# Patient Record
Sex: Female | Born: 1935 | Race: White | Hispanic: No | Smoking: Current every day smoker
Health system: Southern US, Community
[De-identification: ages and names within clinical notes are randomized; demographics above are authoritative.]

## PROBLEM LIST (undated history)

## (undated) DIAGNOSIS — C50919 Malignant neoplasm of unspecified site of unspecified female breast: Secondary | ICD-10-CM

## (undated) HISTORY — PX: BREAST SURGERY: SHX581

## (undated) HISTORY — PX: ABDOMINAL HYSTERECTOMY: SHX81

---

## 2004-07-30 ENCOUNTER — Ambulatory Visit: Payer: Self-pay | Admitting: General Surgery

## 2004-08-18 ENCOUNTER — Other Ambulatory Visit: Payer: Self-pay

## 2004-08-18 ENCOUNTER — Emergency Department: Payer: Self-pay | Admitting: Emergency Medicine

## 2005-09-22 ENCOUNTER — Ambulatory Visit: Payer: Self-pay | Admitting: General Surgery

## 2005-11-12 ENCOUNTER — Ambulatory Visit: Payer: Self-pay | Admitting: General Surgery

## 2007-04-21 ENCOUNTER — Ambulatory Visit: Payer: Self-pay | Admitting: *Deleted

## 2009-02-26 ENCOUNTER — Ambulatory Visit: Payer: Self-pay | Admitting: Family Medicine

## 2009-07-16 ENCOUNTER — Ambulatory Visit: Payer: Self-pay | Admitting: General Surgery

## 2009-08-05 ENCOUNTER — Ambulatory Visit: Payer: Self-pay | Admitting: General Surgery

## 2009-08-15 ENCOUNTER — Ambulatory Visit: Payer: Self-pay | Admitting: General Surgery

## 2010-09-03 ENCOUNTER — Ambulatory Visit: Payer: Self-pay | Admitting: Family Medicine

## 2011-11-18 ENCOUNTER — Ambulatory Visit: Payer: Self-pay | Admitting: Unknown Physician Specialty

## 2012-10-06 ENCOUNTER — Emergency Department: Payer: Self-pay | Admitting: Emergency Medicine

## 2012-10-06 LAB — COMPREHENSIVE METABOLIC PANEL
BUN: 18 mg/dL (ref 7–18)
Bilirubin,Total: 0.4 mg/dL (ref 0.2–1.0)
Creatinine: 1.04 mg/dL (ref 0.60–1.30)
Osmolality: 281 (ref 275–301)
Potassium: 3.9 mmol/L (ref 3.5–5.1)
SGOT(AST): 19 U/L (ref 15–37)
Sodium: 140 mmol/L (ref 136–145)

## 2012-10-06 LAB — URINALYSIS, COMPLETE
Bacteria: NONE SEEN
Blood: NEGATIVE
Glucose,UR: NEGATIVE mg/dL (ref 0–75)
Leukocyte Esterase: NEGATIVE
Protein: NEGATIVE
RBC,UR: 1 /HPF (ref 0–5)
Specific Gravity: 1.008 (ref 1.003–1.030)
Squamous Epithelial: 2
WBC UR: NONE SEEN /HPF (ref 0–5)

## 2012-10-06 LAB — CBC
HGB: 13.5 g/dL (ref 12.0–16.0)
MCH: 30 pg (ref 26.0–34.0)
MCHC: 33.4 g/dL (ref 32.0–36.0)
Platelet: 227 10*3/uL (ref 150–440)
RBC: 4.51 10*6/uL (ref 3.80–5.20)
RDW: 13.8 % (ref 11.5–14.5)
WBC: 7.8 10*3/uL (ref 3.6–11.0)

## 2012-10-06 LAB — PRO B NATRIURETIC PEPTIDE: B-Type Natriuretic Peptide: 75 pg/mL (ref 0–450)

## 2013-11-14 ENCOUNTER — Inpatient Hospital Stay: Payer: Self-pay | Admitting: Specialist

## 2013-11-14 LAB — URINALYSIS, COMPLETE
BACTERIA: NONE SEEN
Bilirubin,UR: NEGATIVE
Blood: NEGATIVE
Glucose,UR: NEGATIVE mg/dL (ref 0–75)
Hyaline Cast: 7
Ketone: NEGATIVE
Leukocyte Esterase: NEGATIVE
Nitrite: NEGATIVE
PH: 5 (ref 4.5–8.0)
Protein: NEGATIVE
Specific Gravity: 1.015 (ref 1.003–1.030)
Squamous Epithelial: <1

## 2013-11-14 LAB — COMPREHENSIVE METABOLIC PANEL
ALBUMIN: 3.5 g/dL (ref 3.4–5.0)
Alkaline Phosphatase: 107 U/L
Anion Gap: 4 — ABNORMAL LOW (ref 7–16)
BILIRUBIN TOTAL: 0.3 mg/dL (ref 0.2–1.0)
BUN: 20 mg/dL — AB (ref 7–18)
CHLORIDE: 106 mmol/L (ref 98–107)
Calcium, Total: 9.4 mg/dL (ref 8.5–10.1)
Co2: 29 mmol/L (ref 21–32)
Creatinine: 1.38 mg/dL — ABNORMAL HIGH (ref 0.60–1.30)
EGFR (Non-African Amer.): 36 — ABNORMAL LOW
GFR CALC AF AMER: 41 — AB
GLUCOSE: 92 mg/dL (ref 65–99)
Osmolality: 280 (ref 275–301)
Potassium: 3.7 mmol/L (ref 3.5–5.1)
SGOT(AST): 12 U/L — ABNORMAL LOW (ref 15–37)
SGPT (ALT): 13 U/L (ref 12–78)
Sodium: 139 mmol/L (ref 136–145)
Total Protein: 7.4 g/dL (ref 6.4–8.2)

## 2013-11-14 LAB — TROPONIN I

## 2013-11-14 LAB — CBC
HCT: 44.5 % (ref 35.0–47.0)
HGB: 14.4 g/dL (ref 12.0–16.0)
MCH: 29.6 pg (ref 26.0–34.0)
MCHC: 32.3 g/dL (ref 32.0–36.0)
MCV: 92 fL (ref 80–100)
PLATELETS: 212 10*3/uL (ref 150–440)
RBC: 4.86 10*6/uL (ref 3.80–5.20)
RDW: 13.5 % (ref 11.5–14.5)
WBC: 8.3 10*3/uL (ref 3.6–11.0)

## 2013-11-14 LAB — CK TOTAL AND CKMB (NOT AT ARMC)
CK, Total: 31 U/L
CK-MB: <0.5 ng/mL — ABNORMAL LOW (ref 0.5–3.6)

## 2013-11-14 LAB — PRO B NATRIURETIC PEPTIDE: B-Type Natriuretic Peptide: 42 pg/mL (ref 0–450)

## 2013-11-15 LAB — CBC WITH DIFFERENTIAL/PLATELET
BASOS ABS: 0 10*3/uL (ref 0.0–0.1)
Basophil %: 0.2 %
Eosinophil #: 0 10*3/uL (ref 0.0–0.7)
Eosinophil %: 0 %
HCT: 43.1 % (ref 35.0–47.0)
HGB: 14.3 g/dL (ref 12.0–16.0)
LYMPHS ABS: 0.8 10*3/uL — AB (ref 1.0–3.6)
Lymphocyte %: 12.1 %
MCH: 30.4 pg (ref 26.0–34.0)
MCHC: 33.2 g/dL (ref 32.0–36.0)
MCV: 92 fL (ref 80–100)
Monocyte #: 0.1 x10 3/mm — ABNORMAL LOW (ref 0.2–0.9)
Monocyte %: 1.6 %
NEUTROS ABS: 5.9 10*3/uL (ref 1.4–6.5)
Neutrophil %: 86.1 %
PLATELETS: 204 10*3/uL (ref 150–440)
RBC: 4.71 10*6/uL (ref 3.80–5.20)
RDW: 13.5 % (ref 11.5–14.5)
WBC: 6.9 10*3/uL (ref 3.6–11.0)

## 2013-11-15 LAB — BASIC METABOLIC PANEL
ANION GAP: 8 (ref 7–16)
BUN: 28 mg/dL — AB (ref 7–18)
CHLORIDE: 106 mmol/L (ref 98–107)
CO2: 26 mmol/L (ref 21–32)
Calcium, Total: 9.4 mg/dL (ref 8.5–10.1)
Creatinine: 1.45 mg/dL — ABNORMAL HIGH (ref 0.60–1.30)
EGFR (African American): 39 — ABNORMAL LOW
EGFR (Non-African Amer.): 33 — ABNORMAL LOW
Glucose: 164 mg/dL — ABNORMAL HIGH (ref 65–99)
OSMOLALITY: 289 (ref 275–301)
Potassium: 4.5 mmol/L (ref 3.5–5.1)
SODIUM: 140 mmol/L (ref 136–145)

## 2013-11-16 LAB — BASIC METABOLIC PANEL
Anion Gap: 9 (ref 7–16)
BUN: 27 mg/dL — ABNORMAL HIGH (ref 7–18)
CHLORIDE: 109 mmol/L — AB (ref 98–107)
Calcium, Total: 9.2 mg/dL (ref 8.5–10.1)
Co2: 23 mmol/L (ref 21–32)
Creatinine: 1.12 mg/dL (ref 0.60–1.30)
EGFR (Non-African Amer.): 46 — ABNORMAL LOW
GFR CALC AF AMER: 53 — AB
GLUCOSE: 132 mg/dL — AB (ref 65–99)
Osmolality: 288 (ref 275–301)
Potassium: 4.7 mmol/L (ref 3.5–5.1)
SODIUM: 141 mmol/L (ref 136–145)

## 2013-11-18 LAB — BASIC METABOLIC PANEL
Anion Gap: 0 — ABNORMAL LOW (ref 7–16)
BUN: 26 mg/dL — ABNORMAL HIGH (ref 7–18)
CALCIUM: 8.7 mg/dL (ref 8.5–10.1)
CO2: 25 mmol/L (ref 21–32)
Chloride: 116 mmol/L — ABNORMAL HIGH (ref 98–107)
Creatinine: 1.2 mg/dL (ref 0.60–1.30)
EGFR (African American): 49 — ABNORMAL LOW
EGFR (Non-African Amer.): 42 — ABNORMAL LOW
Glucose: 65 mg/dL (ref 65–99)
Osmolality: 284 (ref 275–301)
Potassium: 3.9 mmol/L (ref 3.5–5.1)
SODIUM: 141 mmol/L (ref 136–145)

## 2013-11-19 LAB — PLATELET COUNT: PLATELETS: 190 10*3/uL (ref 150–440)

## 2013-12-03 ENCOUNTER — Ambulatory Visit: Payer: Self-pay | Admitting: Pain Medicine

## 2013-12-03 ENCOUNTER — Other Ambulatory Visit: Payer: Self-pay | Admitting: Pain Medicine

## 2013-12-03 LAB — BASIC METABOLIC PANEL
Anion Gap: 7 (ref 7–16)
BUN: 23 mg/dL — AB (ref 7–18)
Calcium, Total: 9.3 mg/dL (ref 8.5–10.1)
Chloride: 105 mmol/L (ref 98–107)
Co2: 28 mmol/L (ref 21–32)
Creatinine: 1.65 mg/dL — ABNORMAL HIGH (ref 0.60–1.30)
GFR CALC AF AMER: 33 — AB
GFR CALC NON AF AMER: 29 — AB
GLUCOSE: 80 mg/dL (ref 65–99)
Osmolality: 282 (ref 275–301)
Potassium: 4.1 mmol/L (ref 3.5–5.1)
Sodium: 140 mmol/L (ref 136–145)

## 2013-12-03 LAB — HEPATIC FUNCTION PANEL A (ARMC)
ALBUMIN: 3.6 g/dL (ref 3.4–5.0)
AST: 25 U/L (ref 15–37)
Alkaline Phosphatase: 115 U/L
Bilirubin, Direct: <0.1 mg/dL (ref 0.00–0.20)
Bilirubin,Total: 0.4 mg/dL (ref 0.2–1.0)
SGPT (ALT): 29 U/L (ref 12–78)
TOTAL PROTEIN: 7.1 g/dL (ref 6.4–8.2)

## 2013-12-03 LAB — SEDIMENTATION RATE: Erythrocyte Sed Rate: 22 mm/hr (ref 0–30)

## 2013-12-03 LAB — MAGNESIUM: MAGNESIUM: 2.4 mg/dL

## 2013-12-20 ENCOUNTER — Ambulatory Visit: Payer: Self-pay | Admitting: Pain Medicine

## 2014-01-08 ENCOUNTER — Emergency Department: Payer: Self-pay | Admitting: Emergency Medicine

## 2014-01-08 LAB — URINALYSIS, COMPLETE
BACTERIA: NONE SEEN
BILIRUBIN, UR: NEGATIVE
Blood: NEGATIVE
Glucose,UR: NEGATIVE mg/dL (ref 0–75)
KETONE: NEGATIVE
Leukocyte Esterase: NEGATIVE
NITRITE: NEGATIVE
PH: 6 (ref 4.5–8.0)
Protein: NEGATIVE
RBC,UR: NONE SEEN /HPF (ref 0–5)
Specific Gravity: 1.005 (ref 1.003–1.030)
Squamous Epithelial: 1
WBC UR: <1 /HPF (ref 0–5)

## 2014-01-08 LAB — COMPREHENSIVE METABOLIC PANEL
Albumin: 3.5 g/dL (ref 3.4–5.0)
Alkaline Phosphatase: 91 U/L
Anion Gap: 6 — ABNORMAL LOW (ref 7–16)
BUN: 17 mg/dL (ref 7–18)
Bilirubin,Total: 0.4 mg/dL (ref 0.2–1.0)
CALCIUM: 9.3 mg/dL (ref 8.5–10.1)
Chloride: 105 mmol/L (ref 98–107)
Co2: 29 mmol/L (ref 21–32)
Creatinine: 1.31 mg/dL — ABNORMAL HIGH (ref 0.60–1.30)
GFR CALC AF AMER: 44 — AB
GFR CALC NON AF AMER: 38 — AB
Glucose: 91 mg/dL (ref 65–99)
OSMOLALITY: 281 (ref 275–301)
Potassium: 3.8 mmol/L (ref 3.5–5.1)
SGOT(AST): 12 U/L — ABNORMAL LOW (ref 15–37)
SGPT (ALT): 19 U/L (ref 12–78)
Sodium: 140 mmol/L (ref 136–145)
Total Protein: 7 g/dL (ref 6.4–8.2)

## 2014-01-08 LAB — CBC
HCT: 39.1 % (ref 35.0–47.0)
HGB: 13.9 g/dL (ref 12.0–16.0)
MCH: 32.9 pg (ref 26.0–34.0)
MCHC: 35.4 g/dL (ref 32.0–36.0)
MCV: 93 fL (ref 80–100)
Platelet: 187 10*3/uL (ref 150–440)
RBC: 4.22 10*6/uL (ref 3.80–5.20)
RDW: 14.4 % (ref 11.5–14.5)
WBC: 6.9 10*3/uL (ref 3.6–11.0)

## 2014-01-08 LAB — CK TOTAL AND CKMB (NOT AT ARMC)
CK, Total: 38 U/L
CK-MB: 0.7 ng/mL (ref 0.5–3.6)

## 2014-01-08 LAB — TROPONIN I: Troponin-I: <0.02 ng/mL

## 2014-02-11 LAB — LIPASE, BLOOD: LIPASE: 215 U/L (ref 73–393)

## 2014-02-11 LAB — CBC WITH DIFFERENTIAL/PLATELET
BASOS PCT: 1.1 %
Basophil #: 0.1 10*3/uL (ref 0.0–0.1)
EOS PCT: 2.7 %
Eosinophil #: 0.2 10*3/uL (ref 0.0–0.7)
HCT: 39.9 % (ref 35.0–47.0)
HGB: 12.7 g/dL (ref 12.0–16.0)
Lymphocyte #: 2.3 10*3/uL (ref 1.0–3.6)
Lymphocyte %: 33.1 %
MCH: 29.8 pg (ref 26.0–34.0)
MCHC: 31.8 g/dL — AB (ref 32.0–36.0)
MCV: 94 fL (ref 80–100)
Monocyte #: 0.6 x10 3/mm (ref 0.2–0.9)
Monocyte %: 8.4 %
NEUTROS PCT: 54.7 %
Neutrophil #: 3.8 10*3/uL (ref 1.4–6.5)
Platelet: 186 10*3/uL (ref 150–440)
RBC: 4.26 10*6/uL (ref 3.80–5.20)
RDW: 14.2 % (ref 11.5–14.5)
WBC: 7 10*3/uL (ref 3.6–11.0)

## 2014-02-11 LAB — BASIC METABOLIC PANEL
Anion Gap: 8 (ref 7–16)
BUN: 17 mg/dL (ref 7–18)
Calcium, Total: 8.9 mg/dL (ref 8.5–10.1)
Chloride: 107 mmol/L (ref 98–107)
Co2: 26 mmol/L (ref 21–32)
Creatinine: 1.27 mg/dL (ref 0.60–1.30)
GFR CALC AF AMER: 46 — AB
GFR CALC NON AF AMER: 39 — AB
Glucose: 82 mg/dL (ref 65–99)
OSMOLALITY: 282 (ref 275–301)
Potassium: 3.8 mmol/L (ref 3.5–5.1)
SODIUM: 141 mmol/L (ref 136–145)

## 2014-02-11 LAB — TROPONIN I

## 2014-02-12 ENCOUNTER — Observation Stay: Payer: Self-pay | Admitting: Internal Medicine

## 2014-02-12 LAB — URINALYSIS, COMPLETE
BILIRUBIN, UR: NEGATIVE
Bacteria: NONE SEEN
Blood: NEGATIVE
Glucose,UR: NEGATIVE mg/dL (ref 0–75)
Hyaline Cast: 3
KETONE: NEGATIVE
Leukocyte Esterase: NEGATIVE
Nitrite: NEGATIVE
PH: 6 (ref 4.5–8.0)
Protein: NEGATIVE
RBC,UR: <1 /HPF (ref 0–5)
SPECIFIC GRAVITY: 1.018 (ref 1.003–1.030)
WBC UR: 1 /HPF (ref 0–5)

## 2014-02-12 LAB — TSH: Thyroid Stimulating Horm: 1.12 u[IU]/mL

## 2014-02-13 LAB — BASIC METABOLIC PANEL
Anion Gap: 9 (ref 7–16)
BUN: 12 mg/dL (ref 7–18)
Calcium, Total: 8.3 mg/dL — ABNORMAL LOW (ref 8.5–10.1)
Chloride: 113 mmol/L — ABNORMAL HIGH (ref 98–107)
Co2: 22 mmol/L (ref 21–32)
Creatinine: 1.15 mg/dL (ref 0.60–1.30)
EGFR (African American): 51 — ABNORMAL LOW
EGFR (Non-African Amer.): 44 — ABNORMAL LOW
Glucose: 88 mg/dL (ref 65–99)
Osmolality: 286 (ref 275–301)
Potassium: 4.2 mmol/L (ref 3.5–5.1)
Sodium: 144 mmol/L (ref 136–145)

## 2014-02-13 LAB — CBC WITH DIFFERENTIAL/PLATELET
Basophil #: 0.1 10*3/uL (ref 0.0–0.1)
Basophil %: 1.2 %
Eosinophil #: 0.2 10*3/uL (ref 0.0–0.7)
Eosinophil %: 3.1 %
HCT: 36.1 % (ref 35.0–47.0)
HGB: 12.1 g/dL (ref 12.0–16.0)
Lymphocyte #: 2 10*3/uL (ref 1.0–3.6)
Lymphocyte %: 30.7 %
MCH: 31.2 pg (ref 26.0–34.0)
MCHC: 33.5 g/dL (ref 32.0–36.0)
MCV: 93 fL (ref 80–100)
Monocyte #: 0.5 x10 3/mm (ref 0.2–0.9)
Monocyte %: 8.3 %
Neutrophil #: 3.7 10*3/uL (ref 1.4–6.5)
Neutrophil %: 56.7 %
Platelet: 168 10*3/uL (ref 150–440)
RBC: 3.88 10*6/uL (ref 3.80–5.20)
RDW: 14 % (ref 11.5–14.5)
WBC: 6.5 10*3/uL (ref 3.6–11.0)

## 2014-02-13 LAB — TSH: Thyroid Stimulating Horm: 2.24 u[IU]/mL

## 2014-03-06 ENCOUNTER — Ambulatory Visit: Payer: Self-pay | Admitting: Pain Medicine

## 2014-03-21 ENCOUNTER — Ambulatory Visit: Payer: Self-pay | Admitting: Pain Medicine

## 2014-04-22 ENCOUNTER — Ambulatory Visit: Payer: Self-pay | Admitting: Pain Medicine

## 2014-05-09 ENCOUNTER — Ambulatory Visit: Payer: Self-pay | Admitting: Pain Medicine

## 2014-10-19 NOTE — H&P (Signed)
PATIENT NAME:  Jasmine Hays, Jasmine Hays MR#:  [id number] DATE OF BIRTH:  1930/05/05  DATE OF ADMISSION:  11/14/2013  ADDENDUM  ASSESSMENT AND PLAN: 7. Tobacco abuse. Smoking cessation counseling done three minutes by me. Nicotine patch ordered.   TIME SPENT ON ADMISSION: Total time 55 minutes.    ____________________________ Tana Conch. Leslye Peer, MD rjw:sg D: 11/14/2013 11:53:03 ET T: 11/14/2013 12:03:24 ET JOB#: [id number]  cc: Tana Conch. Leslye Peer, MD, <Dictator> Marisue Brooklyn MD ELECTRONICALLY SIGNED 11/15/2013 19:36
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
## 2014-10-19 NOTE — Discharge Summary (Signed)
PATIENT NAME:  Jasmine Hays, Jasmine Hays MR#:  [id number] DATE OF BIRTH:  [birth year]  DATE OF ADMISSION:  02/12/2014 DATE OF DISCHARGE:  02/13/2014   ADMITTING DIAGNOSES: 1.  Lower extremity weakness, acute on chronic.  2.  Constipation.  3.  Chronic obstructive pulmonary disease without exacerbation.  4.  Tobacco abuse.  5.  Hyperlipidemia.  6.  Depression.   CONSULTATIONS: None.   PROCEDURES: None.   BRIEF HISTORY OF PRESENT ILLNESS: This very pleasant 81 year old female presents to the Emergency Department due to inability to stand due to lower extremity weakness. The patient was recently discharged from the hospital for the same complaints and had been working with physical therapy until today. Today, the physical therapist determined that the patient had become acutely weaker. She denies any pain, but admits to having weakness in the lower extremities. She also admits to having decreased appetite and decreased intake of fluid.   HOSPITAL COURSE BY PROBLEM: 1.  Lower extremity weakness, acute on chronic: The patient has been working with physical therapy for this prior to admission. The patient has spinal stenosis and now also has constipation, which may be complicating the stenosis and lower extremity weakness. Physical therapy evaluation recommends SNF placement. However, the patient and family have declined. She will restart home health physical therapy. She may need a home health aide as well, as the patient feels her daughter is not able to perform all tests involved in her care, specifically bathing.  2.  Constipation: Treated with MiraLax twice a day as well as a soapsuds enema today to facilitate bowel movement. This is a chronic condition, but should be followed up on by her primary care physician as constipation can complicate her stenosis and lower extremity weakness.  3.  COPD without exacerbation: No changes were made to her home regimen during or after hospitalization.  4.  Ongoing  tobacco abuse: She was treated with Nicoderm patches during hospitalization, so smoking cessation counseling was provided.  5.  Hyperlipidemia: Continue statin.  6.  Depression: The patient denied any active signs of depression. She will continue on Wellbutrin.   DISCHARGE MEDICATIONS:  1.  Proventil HFA 90 mcg/inhalation, 2 puffs every 4 hours as needed for shortness of breath.  2.  Spiriva 18 mcg, 1 capsule inhaled daily.  3.  Lasix 20 mg, 1 tablet once a day.  4.  Lisinopril 20 mg, 1 tablet daily.  5.  Bupropion 150 mcg, 1 tablet 2 times a day.  6.  Lidocaine 5% topical film, apply to affected area once a day.  7.  Colace 100 mg, 1 capsule twice a day.  8.  Clonazepam 1 mg, 1 tablet once a day at bedtime.  9.  Tramadol 50 mg, 1 tablet every 6 hours as needed for pain.  10.  Vitamin D3 at 2000 international units, 1 tablet once a day x 30 days.  11.  Trazodone 100 mg, 1 tablet once a day at bedtime.  12.  Pravastatin 20 mg, 1 tablet once a day at bedtime.  13.  Polyethylene glycol 3350 oral power, 17 grams orally twice a day for bowel movements.  DISCHARGE EXAMINATION: VITAL SIGNS: Day of discharge, temperature 98.2, pulse 64, respirations 18, blood pressure 134/79, pulse oximetry 94% on room air.  GENERAL: No acute distress.  RESPIRATORY: Lungs are clear to auscultation bilaterally with good air movement.  CARDIOVASCULAR: Regular rate and rhythm. No murmurs, rubs or gallops. No lower extremity edema, no JVD.  ABDOMEN: Denies tenderness. Abdomen is  soft, slightly distended, normal bowel sounds.  EXTREMITIES: No edema, no cyanosis or clubbing.  SKIN: Normal to palpation. No rash.  NEUROLOGIC: Cranial nerves are intact, sensation is intact, bilateral lower extremities are weak at 4/5.  PSYCHIATRIC: The patient is alert and oriented to time, place, person and has good insight.   LABORATORY DATA: Sodium 144, potassium 4.2, chloride 113, bicarbonate 23, BUN 12, creatinine 1.15. TSH is  2.24. [place]ak blood cell count 6.5, hemoglobin 12.1, platelets 164,000, MCV is 93. Urine is negative for signs of infection.   DISPOSITION: The patient is stable for discharge to home, she has been advised to go to a skilled nursing facility for continued intensive physical therapy but she has declined. She will have home health physical therapy.   DIET: Heart healthy diet.   ACTIVITY: As tolerated and per physical therapy recommendations.   FOLLOWUP: The patient is advised to follow up with her primary care physician within 1-2 weeks of discharge.    ____________________________ Earleen Newport. Volanda Napoleon, MD cpw:TT D: 02/13/2014 16:56:38 ET T: 02/13/2014 17:31:54 ET JOB#: [id number]  cc: Barnetta Chapel P. Volanda Napoleon, MD, <Dictator> Aldean Jewett MD ELECTRONICALLY SIGNED 02/21/2014 13:15

## 2014-10-19 NOTE — Discharge Summary (Signed)
PATIENT NAME:  Jasmine Hays, Jasmine Hays MR#:  [id number] DATE OF BIRTH:  1930-05-07  DATE OF ADMISSION:  11/14/2013 DATE OF DISCHARGE:  11/21/2013   For a detailed note, please take a look at the history and physical done on admission by Dr. Loletha Grayer.   DIAGNOSES AT DISCHARGE: As follows:  1. Weakness secondary to spinal stenosis.  2. Chronic obstructive pulmonary disease exacerbation.  3. Hyperlipidemia.  4. Depression.  5. Tobacco abuse.  6. Constipation.   DIET: The patient is being discharged on a low-sodium, low-fat diet.   ACTIVITY: As tolerated.   FOLLOWUP: With Dr. Delight Stare in the next 1 to 2 weeks.   DISCHARGE MEDICATIONS: 1. Lisinopril 20 mg daily.  2. Albuterol inhaler 2 puffs every 4 hours as needed. 3. Spiriva 1 puff daily. 4. Pravachol 20 mg daily. 5. Lasix 20 mg daily. 6. Trazodone 50 mg at bedtime. 7. Wellbutrin 150 mg b.i.d. 8. Klonopin 1 mg t.i.d. 9. Lidocaine patch to be applied daily.  10. Prednisone taper starting at 50 mg down to 10 mg over the next 5 days.  11. Tramadol 50 mg q.6 hours as needed.  12. Colace 100 mg b.i.d. 13. MiraLax daily as needed. 14. Nicotine patch 14 mg transdermal daily.   PERTINENT STUDIES DONE DURING THE HOSPITAL COURSE: As follows:  A CT scan of the head done without contrast on May 20th showing no acute intracranial pathology.  A chest x-ray done on admission showing no acute infiltrate or pneumothorax.  X-ray of the left hip showing no acute left hip fracture.  An MRI of the lumbar spine done without contrast showing slight progression of the acquired spinal stenosis at L1-L2, L2-L3 and L3-L4. Acquired stenosis of the L4-L5 and L5-S1 is stable.   HOSPITAL COURSE: This is an 81 year old female with medical problems as mentioned above, who presented to the hospital on 11/14/2013 due to difficulty walking and generalized weakness.   1. Weakness and difficulty ambulating. The patient presented to the hospital with difficulty  getting out of bed and lower extremity weakness. She was thought to possibly have underlying CVA, and therefore had a CT head on admission, which was negative. She continued to have significant lower extremity weakness, and therefore an MRI of the lumbar spine was obtained, which showed spinal stenosis. This was likely the cause of the patient's progressive weakness. A physical therapy consult was obtained to evaluate the patient's mobility status, and they thought she would benefit from short-term rehab, which is where presently she is being discharged to.  2. Lower back pain with spinal stenosis. This was confirmed based on the MRI study, as mentioned above. The patient has been maintained on some Lidoderm patch and some tramadol, which she will continue for her back pain.  3. Chronic obstructive pulmonary disease exacerbation. This was secondary to ongoing tobacco abuse. There was no evidence of acute pneumonia. There was possible underlying bronchitis. The patient has received Zithromax for the acute bronchitis. Is currently being discharged on albuterol inhaler and Spiriva daily. Her acute exacerbation has now resolved.  4. Tobacco abuse. The patient was maintained on nicotine patch, and she is being discharged on that presently.  5. Hypertension. The patient has remained hemodynamically stable in the hospital on her antihypertensives. She will continue her lisinopril as stated.  6. Depression. The patient was maintained on her Wellbutrin and Klonopin, and she will also resume that upon discharge.  7. Hyperlipidemia. The patient was maintained on Pravachol, and she will resume that  upon discharge too.   CODE STATUS: The patient is a full code.   DISPOSITION: She is being discharged to a skilled nursing facility.   TIME SPENT: 40 minutes.   ____________________________ Belia Heman. Verdell Carmine, MD vjs:lb D: 11/21/2013 11:49:51 ET T: 11/21/2013 12:07:15 ET JOB#: [id number]  cc: Belia Heman. Verdell Carmine, MD,  <Dictator> Marguerita Merles, MD Henreitta Leber MD ELECTRONICALLY SIGNED 11/21/2013 15:09
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
## 2015-04-08 ENCOUNTER — Other Ambulatory Visit: Payer: Self-pay | Admitting: Internal Medicine

## 2015-11-16 ENCOUNTER — Emergency Department
Admission: EM | Admit: 2015-11-16 | Discharge: 2015-11-16 | Disposition: A | Discharge status: Home / Self Care | Payer: Medicare PPO | Attending: Emergency Medicine | Admitting: Emergency Medicine

## 2015-11-16 ENCOUNTER — Emergency Department: Payer: Medicare PPO

## 2015-11-16 DIAGNOSIS — M25551 Pain in right hip: Secondary | ICD-10-CM | POA: Diagnosis present

## 2015-11-16 DIAGNOSIS — G8929 Other chronic pain: Secondary | ICD-10-CM | POA: Diagnosis not present

## 2015-11-16 DIAGNOSIS — Z853 Personal history of malignant neoplasm of breast: Secondary | ICD-10-CM | POA: Insufficient documentation

## 2015-11-16 DIAGNOSIS — F17210 Nicotine dependence, cigarettes, uncomplicated: Secondary | ICD-10-CM | POA: Insufficient documentation

## 2015-11-16 HISTORY — DX: Malignant neoplasm of unspecified site of unspecified female breast: C50.919

## 2015-11-16 LAB — BASIC METABOLIC PANEL
ANION GAP: 4 — AB (ref 5–15)
BUN: 21 mg/dL — ABNORMAL HIGH (ref 6–20)
CALCIUM: 9.3 mg/dL (ref 8.9–10.3)
CO2: 28 mmol/L (ref 22–32)
CREATININE: 1.19 mg/dL — AB (ref 0.44–1.00)
Chloride: 107 mmol/L (ref 101–111)
GFR, EST AFRICAN AMERICAN: 47 mL/min — AB (ref >60)
GFR, EST NON AFRICAN AMERICAN: 41 mL/min — AB (ref >60)
Glucose, Bld: 96 mg/dL (ref 65–99)
Potassium: 4 mmol/L (ref 3.5–5.1)
SODIUM: 139 mmol/L (ref 135–145)

## 2015-11-16 LAB — CBC
HCT: 41 % (ref 35.0–47.0)
Hemoglobin: 13.8 g/dL (ref 12.0–16.0)
MCH: 29.6 pg (ref 26.0–34.0)
MCHC: 33.7 g/dL (ref 32.0–36.0)
MCV: 88 fL (ref 80.0–100.0)
PLATELETS: 202 10*3/uL (ref 150–440)
RBC: 4.66 MIL/uL (ref 3.80–5.20)
RDW: 13.9 % (ref 11.5–14.5)
WBC: 8.6 10*3/uL (ref 3.6–11.0)

## 2015-11-16 LAB — URINALYSIS COMPLETE WITH MICROSCOPIC (ARMC ONLY)
BACTERIA UA: NONE SEEN
BILIRUBIN URINE: NEGATIVE
GLUCOSE, UA: NEGATIVE mg/dL
HGB URINE DIPSTICK: NEGATIVE
Ketones, ur: NEGATIVE mg/dL
LEUKOCYTES UA: NEGATIVE
NITRITE: NEGATIVE
Protein, ur: NEGATIVE mg/dL
SPECIFIC GRAVITY, URINE: 1.016 (ref 1.005–1.030)
pH: 6 (ref 5.0–8.0)

## 2015-11-16 MED ORDER — HYDROCODONE-ACETAMINOPHEN 5-325 MG PO TABS: 0.5000 | ORAL_TABLET | Freq: Four times a day (QID) | ORAL | Status: DC | PRN

## 2015-11-16 MED ORDER — MORPHINE SULFATE (PF) 2 MG/ML IV SOLN
2.0000 mg | Freq: Once | INTRAVENOUS | Status: AC
  Administered 2015-11-16: 2 mg via INTRAVENOUS
  Filled 2015-11-16: qty 1

## 2015-11-16 MED ORDER — SODIUM CHLORIDE 0.9 % IV BOLUS (SEPSIS)
500.0000 mL | Freq: Once | INTRAVENOUS | Status: AC
  Administered 2015-11-16: 500 mL via INTRAVENOUS

## 2015-11-16 NOTE — ED Notes (Signed)
Pt came to ED via EMS from home c/o right hip pain. Pt reports this morning slid out of chair. Reports right hip still hurts, but has been hurting for four days. VS stable.

## 2015-11-16 NOTE — Discharge Instructions (Signed)
Please follow-up with Dr. Rebecka Apley tomorrow as scheduled.  Hip Pain Your hip is the joint between your upper legs and your lower pelvis. The bones, cartilage, tendons, and muscles of your hip joint perform a lot of work each day supporting your body weight and allowing you to move around. Hip pain can range from a minor ache to severe pain in one or both of your hips. Pain may be felt on the inside of the hip joint near the groin, or the outside near the buttocks and upper thigh. You may have swelling or stiffness as well.  HOME CARE INSTRUCTIONS   Take medicines only as directed by your health care provider.  Apply ice to the injured area:  Put ice in a plastic bag.  Place a towel between your skin and the bag.  Leave the ice on for 15-20 minutes at a time, 3-4 times a day.  Keep your leg raised (elevated) when possible to lessen swelling.  Avoid activities that cause pain.  Follow specific exercises as directed by your health care provider.  Sleep with a pillow between your legs on your most comfortable side.  Record how often you have hip pain, the location of the pain, and what it feels like. SEEK MEDICAL CARE IF:   You are unable to put weight on your leg.  Your hip is red or swollen or very tender to touch.  Your pain or swelling continues or worsens after 1 week.  You have increasing difficulty walking.  You have a fever. SEEK IMMEDIATE MEDICAL CARE IF:   You have fallen.  You have a sudden increase in pain and swelling in your hip. MAKE SURE YOU:   Understand these instructions.  Will watch your condition.  Will get help right away if you are not doing well or get worse.   This information is not intended to replace advice given to you by your health care provider. Make sure you discuss any questions you have with your health care provider.   Document Released: 12/02/2009 Document Revised: 07/05/2014 Document Reviewed: 02/08/2013 Elsevier Interactive  Patient Education [employer].

## 2015-11-16 NOTE — ED Notes (Signed)
Pt transported to xray 

## 2015-11-16 NOTE — ED Provider Notes (Signed)
[facility] Emergency Department Provider Note  ____________________________________________  Time seen: Approximately 1:55 PM  I have reviewed the triage vital signs and the nursing notes.   HISTORY  Chief Complaint Hip Pain    HPI Jasmine Hays is a 82 y.o. female the history of breast cancer.  He reports that for the last several days she's been having aching pain in the area of her right hip. Today she went to get up and do the pain she slid down and landed on her buttock. Her daughter had to help her up. She normally uses a walker, and she reports that she's had pain in the right hip has been making it hard for her to use a walker.  No fevers chills weakness nausea or vomiting. She does report some general fatigue, but this is not uncommon for her.  Patient's daughter is present, also give similar history.  No recent illness fevers chills. Denies any fall or she struck her head or any other injury. She has had similar pain in the past where she had had injections, somewhere along her back per the daughter. No change in bowel or bladder habits. Patient denying pain in the lower back at this time.   Past Medical History  Diagnosis Date  . Breast cancer ([place])     There are no active problems to display for this patient.   Past Surgical History  Procedure Laterality Date  . Breast surgery      lumpectomy  . Abdominal hysterectomy      Current Outpatient Rx  Name  Route  Sig  Dispense  Refill  . HYDROcodone-acetaminophen (NORCO/VICODIN) 5-325 MG tablet   Oral   Take 0.5 tablets by mouth every 6 (six) hours as needed for moderate pain.   4 tablet   0   She does take tramadol at home, but will discontinue it and she will be taking hydrocodone until seeing Dr. Rebecka Apley tomorrow.  Allergies Review of patient's allergies indicates no known allergies.  No family history on file.  Social History Social History  Substance Use Topics  .  Smoking status: Current Every Day Smoker -- 1.50 packs/day    Types: Cigarettes  . Smokeless tobacco: None  . Alcohol Use: No    Review of Systems Constitutional: No fever/chills Eyes: No visual changes. ENT: No sore throat. Cardiovascular: Denies chest pain. Respiratory: Denies shortness of breath. Gastrointestinal: No abdominal pain.  No nausea, no vomiting.  No diarrhea.  No constipation. Genitourinary: Negative for dysuria. Musculoskeletal: Negative for back pain. Skin: Negative for rash. Neurological: Negative for headaches, focal weakness or numbness.  10-point ROS otherwise negative.  ____________________________________________   PHYSICAL EXAM:  VITAL SIGNS: ED Triage Vitals  Enc Vitals Group     BP 11/16/15 1005 134/67 mmHg     Pulse Rate 11/16/15 1005 75     Resp 11/16/15 1005 16     Temp 11/16/15 1005 97.9 F (36.6 C)     Temp Source 11/16/15 1005 Oral     SpO2 11/16/15 1005 97 %     Weight 11/16/15 1005 160 lb (72.576 kg)     Height 11/16/15 1005 6\' 6"  (1.981 m)     Head Cir --      Peak Flow --      Pain Score --      Pain Loc --      Pain Edu? --      Excl. in [place]? --    Constitutional: Alert  and oriented. Well appearing and in no acute distress. Eyes: Conjunctivae are normal. PERRL. EOMI. Head: Atraumatic. Nose: No congestion/rhinnorhea. Mouth/Throat: Mucous membranes are moist.  Oropharynx non-erythematous. Neck: No stridor.   Cardiovascular: Normal rate, regular rhythm. Grossly normal heart sounds.  Good peripheral circulation. Respiratory: Normal respiratory effort.  No retractions. Lungs CTAB. Gastrointestinal: Soft and nontender. No distention.  Musculoskeletal:    Lower Extremities  No edema. Normal DP/PT pulses bilateral with good cap refill.  Normal neuro-motor function lower extremities bilateral.  RIGHT Right lower extremity demonstrates normal strength, good use of all muscles. No edema bruising or contusions of the right hip,  right knee, right ankleBut she does have mild very focal tenderness without erythema or overlying skin lesion or redness at approximately the right lateral hip joint. Full range of motion of the right lower extremity without pain. No pain on axial loading. No evidence of trauma.  LEFT Left lower extremity demonstrates normal strength, good use of all muscles. No edema bruising or contusions of the hip,  knee, ankle. Full range of motion of the left lower extremity without pain. No pain on axial loading. No evidence of trauma.   Neurologic:  Normal speech and language. No gross focal neurologic deficits are appreciated. No gait instability. Skin:  Skin is warm, dry and intact. No rash noted. Psychiatric: Mood and affect are normal. Speech and behavior are normal.  ____________________________________________   LABS (all labs ordered are listed, but only abnormal results are displayed)  Labs Reviewed  BASIC METABOLIC PANEL - Abnormal; Notable for the following:    BUN 21 (*)    Creatinine, Ser 1.19 (*)    GFR calc non Af Amer 41 (*)    GFR calc Af Amer 47 (*)    Anion gap 4 (*)    All other components within normal limits  URINALYSIS COMPLETEWITH MICROSCOPIC ([facility] ONLY) - Abnormal; Notable for the following:    Color, Urine YELLOW (*)    APPearance CLEAR (*)    Squamous Epithelial / LPF 0-5 (*)    All other components within normal limits  CBC   ____________________________________________  EKG  Reviewed injury by me at 1145 Heart rate 53 QRS 100 QTc 400 Normal sinus rhythm, no acute ST abnormality noted. Using nonspecific mild T-wave abnormality noted in V1 through V3, but no evidence of ischemic change noted. ____________________________________________  RADIOLOGY  CT Hip Right Wo Contrast (Final result) Result time: 11/16/15 12:23:09   Final result by Rad Results In Interface (11/16/15 12:23:09)   Narrative:   CLINICAL DATA: Right hip pain after sliding out of a  chair. Duration: 4-5 days.  EXAM: CT OF THE RIGHT HIP WITHOUT CONTRAST  TECHNIQUE: Multidetector CT imaging of the right hip was performed according to the standard protocol. Multiplanar CT image reconstructions were also generated.  COMPARISON: 11/16/2015  FINDINGS: On the scout images, there appears to be chronic deformity of the left pubic rami. This is attributable to old fractures.  Mild acetabular spurring. No fracture identified. No hip joint effusion or adjacent fracture of the included bony pelvis.  Mild to moderate loss of articular space in the hip, degenerative. No sciatic notch or obturator impingement. Iliotibial band unremarkable.  IMPRESSION: 1. No acute fracture or derangement of the right hip is apparent on today's CT scan. 2. The scout images appear to demonstrate deformity of the left pubic rami compatible of old healed fractures.   Electronically Signed By: Van Clines M.D. On: 11/16/2015 12:23  DG HIP UNILAT WITH PELVIS 2-3 VIEWS RIGHT (Final result) Result time: 11/16/15 11:27:19   Final result by Rad Results In Interface (11/16/15 11:27:19)   Narrative:   CLINICAL DATA: Right hip pain x2 weeks  EXAM: DG HIP (WITH OR WITHOUT PELVIS) 2-3V RIGHT  COMPARISON: None.  FINDINGS: No fracture or dislocation is seen.  The joint spaces are preserved.  Visualized bony pelvis appears intact.  Mild degenerative changes of the lower lumbar spine.  IMPRESSION: No fracture or dislocation is seen.   Electronically Signed By: Julian Hy M.D. On: 11/16/2015 11:27       ____________________________________________   PROCEDURES  Procedure(s) performed: None  Critical Care performed: No  ____________________________________________   INITIAL IMPRESSION / ASSESSMENT AND PLAN / ED COURSE  Pertinent labs & imaging results that were available during my care of the patient were reviewed by me and  considered in my medical decision making (see chart for details).  Patient has for evaluation of right hip pain. Evidently does have a history of similar pain in the past, was treated with joint injections per the patient's daughter. She is awake alert stable hemodynamically in no distress. She has no evidence of major trauma or injury. She did slip down on her buttock today, but was a very minimal mechanism did not strike her head or neck. She has some generalized weakness, though discussed with the patient and daughter. Is a somewhat chronic and she uses a walker at baseline. Labs today very reassuring, evaluation a very reassuring. Her pain was improved after morphine, and family states they think she needs more intensive physical therapy and that she has an appointment actually tomorrow with her primary care discussed. I think is reasonable discharge her, give her a very brief and small prescription for pain medicine given her age and she is going to stop drinking tramadol and attempt hydrocodone instead for the next night. She'll follow-up Dr. Rebecka Apley tomorrow.  Careful return precautions advised. Patient does report her pain is improved and she resting comfortable with time and discharged no distress awake and alert. ____________________________________________   FINAL CLINICAL IMPRESSION(S) / ED DIAGNOSES  Final diagnoses:  Hip pain, chronic, right      Delman Kitten, MD 11/16/15 1711

## 2015-11-23 ENCOUNTER — Emergency Department
Admission: EM | Admit: 2015-11-23 | Discharge: 2015-11-23 | Disposition: A | Discharge status: Home / Self Care | Payer: Medicare PPO | Attending: Emergency Medicine | Admitting: Emergency Medicine

## 2015-11-23 ENCOUNTER — Encounter: Payer: Self-pay | Admitting: Emergency Medicine

## 2015-11-23 ENCOUNTER — Emergency Department: Payer: Medicare PPO

## 2015-11-23 DIAGNOSIS — R531 Weakness: Secondary | ICD-10-CM | POA: Insufficient documentation

## 2015-11-23 DIAGNOSIS — Z853 Personal history of malignant neoplasm of breast: Secondary | ICD-10-CM | POA: Insufficient documentation

## 2015-11-23 DIAGNOSIS — W0110XA Fall on same level from slipping, tripping and stumbling with subsequent striking against unspecified object, initial encounter: Secondary | ICD-10-CM | POA: Diagnosis not present

## 2015-11-23 DIAGNOSIS — S0990XA Unspecified injury of head, initial encounter: Secondary | ICD-10-CM | POA: Diagnosis not present

## 2015-11-23 DIAGNOSIS — Y999 Unspecified external cause status: Secondary | ICD-10-CM | POA: Diagnosis not present

## 2015-11-23 DIAGNOSIS — F17210 Nicotine dependence, cigarettes, uncomplicated: Secondary | ICD-10-CM | POA: Insufficient documentation

## 2015-11-23 DIAGNOSIS — Y929 Unspecified place or not applicable: Secondary | ICD-10-CM | POA: Diagnosis not present

## 2015-11-23 DIAGNOSIS — Z79899 Other long term (current) drug therapy: Secondary | ICD-10-CM | POA: Diagnosis not present

## 2015-11-23 DIAGNOSIS — M25551 Pain in right hip: Secondary | ICD-10-CM | POA: Insufficient documentation

## 2015-11-23 DIAGNOSIS — Y939 Activity, unspecified: Secondary | ICD-10-CM | POA: Insufficient documentation

## 2015-11-23 DIAGNOSIS — W19XXXA Unspecified fall, initial encounter: Secondary | ICD-10-CM

## 2015-11-23 LAB — URINALYSIS COMPLETE WITH MICROSCOPIC (ARMC ONLY)
BILIRUBIN URINE: NEGATIVE
Bacteria, UA: NONE SEEN
Glucose, UA: NEGATIVE mg/dL
HGB URINE DIPSTICK: NEGATIVE
KETONES UR: NEGATIVE mg/dL
LEUKOCYTES UA: NEGATIVE
Nitrite: NEGATIVE
PH: 6 (ref 5.0–8.0)
Protein, ur: NEGATIVE mg/dL
RBC / HPF: NONE SEEN RBC/hpf (ref 0–5)
SPECIFIC GRAVITY, URINE: 1.017 (ref 1.005–1.030)

## 2015-11-23 LAB — COMPREHENSIVE METABOLIC PANEL WITH GFR
ALT: 22 U/L (ref 14–54)
AST: 23 U/L (ref 15–41)
Albumin: 4.2 g/dL (ref 3.5–5.0)
Alkaline Phosphatase: 104 U/L (ref 38–126)
Anion gap: 8 (ref 5–15)
BUN: 19 mg/dL (ref 6–20)
CO2: 30 mmol/L (ref 22–32)
Calcium: 10 mg/dL (ref 8.9–10.3)
Chloride: 102 mmol/L (ref 101–111)
Creatinine, Ser: 1.39 mg/dL — ABNORMAL HIGH (ref 0.44–1.00)
GFR calc Af Amer: 39 mL/min — ABNORMAL LOW
GFR calc non Af Amer: 34 mL/min — ABNORMAL LOW
Glucose, Bld: 114 mg/dL — ABNORMAL HIGH (ref 65–99)
Potassium: 3.6 mmol/L (ref 3.5–5.1)
Sodium: 140 mmol/L (ref 135–145)
Total Bilirubin: 0.5 mg/dL (ref 0.3–1.2)
Total Protein: 7.8 g/dL (ref 6.5–8.1)

## 2015-11-23 LAB — CBC
HCT: 47.5 % — ABNORMAL HIGH (ref 35.0–47.0)
Hemoglobin: 15.7 g/dL (ref 12.0–16.0)
MCH: 29.8 pg (ref 26.0–34.0)
MCHC: 33.1 g/dL (ref 32.0–36.0)
MCV: 89.8 fL (ref 80.0–100.0)
Platelets: 242 K/uL (ref 150–440)
RBC: 5.29 MIL/uL — ABNORMAL HIGH (ref 3.80–5.20)
RDW: 13.9 % (ref 11.5–14.5)
WBC: 8.5 K/uL (ref 3.6–11.0)

## 2015-11-23 LAB — TROPONIN I: Troponin I: <0.03 ng/mL

## 2015-11-23 MED ORDER — OXYCODONE-ACETAMINOPHEN 5-325 MG PO TABS
2.0000 | ORAL_TABLET | Freq: Once | ORAL | Status: AC
  Administered 2015-11-23: 2 via ORAL
  Filled 2015-11-23: qty 2

## 2015-11-23 NOTE — Care Management Note (Signed)
Case Management Note  Patient Details  Name: Jasmine Hays MRN: [id number] Date of Birth: June 13, 1930  Subjective/Objective:   Patient choose [facility] for Physical Therapy evaluation. Information with phone number given to patient and daughter.                 Action/Plan:   Expected Discharge Date:                  Expected Discharge Plan:     In-House Referral:     Discharge planning Services     Post Acute Care Choice:    Choice offered to:     DME Arranged:    DME Agency:     HH Arranged:    [place] Agency:     Status of Service:     Medicare Important Message Given:    Date Medicare IM Given:    Medicare IM give by:    Date Additional Medicare IM Given:    Additional Medicare Important Message give by:     If discussed at [place] of Stay Meetings, dates discussed:    Additional Comments:  Ival Bible, RN 11/23/2015, 3:37 PM

## 2015-11-23 NOTE — ED Notes (Signed)
Patient brought in by [facility] from home for a fall this morning. Patient states that she has been having increased back pain for "a while". Recently she has been having problems with her right leg "giving out". Patient states that she fell twice this morning. Patient does not have any obvious deformities. Does not c/o pain anywhere else at this time

## 2015-11-23 NOTE — ED Notes (Signed)
Patient unable to urinate at this time. 

## 2015-11-23 NOTE — Progress Notes (Signed)
LCSW informed patient that there was only one bed offer and that was [facility]) and that they would not accept patient unless they had [place] number. ( Would not accept LOG). Patient did not want her mother in [place] and will call other SNF's. LCSW provided new list/other resources ( Dementia Services Handout) to0 daughter.  [employer] LCSW [phone]

## 2015-11-23 NOTE — Progress Notes (Signed)
LCSW sent out pt information to several local SNF and am awaiting bed offers. Patients daughter reports she is unable to provide the care and extremely fearful of Mom falling again. Awaiting PT consult.  [employer] LCSW [phone]
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
## 2015-11-23 NOTE — Discharge Instructions (Signed)
Weakness °Weakness is a lack of strength. You may feel weak all over your body or just in one part of your body. Weakness can be serious. In some cases, you may need more medical tests. °HOME CARE °· Rest. °· Eat a well-balanced diet. °· Try to exercise every day. °· Only take medicines as told by your doctor. °GET HELP RIGHT AWAY IF:  °· You cannot do your normal daily activities. °· You cannot walk up and down stairs, or you feel very tired when you do so. °· You have shortness of breath or chest pain. °· You have trouble moving parts of your body. °· You have weakness in only one body part or on only one side of the body. °· You have a fever. °· You have trouble speaking or swallowing. °· You cannot control when you pee (urinate) or poop (bowel movement). °· You have black or bloody throw up (vomit) or poop. °· Your weakness gets worse or spreads to other body parts. °· You have new aches or pains. °MAKE SURE YOU:  °· Understand these instructions. °· Will watch your condition. °· Will get help right away if you are not doing well or get worse. °  °This information is not intended to replace advice given to you by your health care provider. Make sure you discuss any questions you have with your health care provider. °  °Document Released: 05/27/2008 Document Revised: 12/14/2011 Document Reviewed: 08/13/2011 °Elsevier Interactive Patient Education ©2016 Else[employer] Inc. ° °

## 2015-11-23 NOTE — Clinical Social Work Note (Signed)
Clinical Social Work Assessment  Patient Details  Name: Jasmine Hays MRN: [id number] Date of Birth: 1929-08-08  Date of referral:  11/23/15               Reason for consult:  Facility Placement                Permission sought to share information with:  Family Supports, Customer service manager Permission granted to share information::  Yes, Verbal Permission Granted  Name::     Mikle Bosworth  [phone] Pandora Leiter [phone]  Agency::  yes  Relationship::     Contact Information:     Housing/Transportation Living arrangements for the past 2 months:  [place] of Information:  Patient, Adult Children Patient Interpreter Needed:  None Criminal Activity/Legal Involvement Pertinent to Current Situation/Hospitalization:  No - Comment as needed Significant Relationships:  Adult Children, Siblings Lives with:  Adult Children Do you feel safe going back to the place where you live?  Yes Need for family participation in patient care:  Yes (Comment)  Care giving concerns:  Daughter wants her in a scared    Social Worker assessment / plan:  LCSW met with patient and her daughter. Pastient has been having numerous falls due to weakness in her legs. She resides at home and her daughter is unable to take care of her at this time. Patient needs assistance with bathing and dressing and struggles with a walker. She is incontinent ( bladder). Patient is blind in right eye. Patient during our assessment reports the man is in the room. Patient has 4 children but only one daughter able to provide care. Verbal consent given to contact facilities. Awaiting PT consult.  Employment status:  Retired Forensic scientist:  [employer], [employer]) PT Recommendations:  Not assessed at this time Information / Referral to community resources:  [place]  Patient/Family's Response to care: Daughter struggles to take care of her Mom at this time and feels her mom would  get stronger faster at a rehab center    Patient/Family's Understanding of and Emotional Response to Diagnosis, Current Treatment, and Prognosis: The patient understands she needs therapy to make her stronger and feels overwhelmed that her daughter is overwhelmed.  Emotional Assessment Appearance:  Appears stated age Attitude/Demeanor/Rapport:   (oriented x4) Affect (typically observed):  Accepting, Calm Orientation:  Oriented to Self, Oriented to Place, Oriented to  Time, Oriented to Situation Alcohol / Substance use:  Tobacco Use Psych involvement (Current and /or in the community):  No (Comment)  Discharge Needs  Concerns to be addressed:  Basic Needs Readmission within the last 30 days:  No Current discharge risk:  Lack of support system Barriers to Discharge:  [place], [place] 11/23/2015, 9:31 AM

## 2015-11-23 NOTE — ED Notes (Signed)
Pt ambulated well with steady gait with the use of her walker.

## 2015-11-23 NOTE — Progress Notes (Signed)
LCSW met with patient and patients daughter. LCSW explained that PT consultion will not likely be done today and then it may take a day or so for [facility] to review and approve patients for STR. Daughter refused to have her mother in the ED for 1-2 more days.  LCSW reviewed other home options of support.  Family will call [facility] program on Tuesday.  LCSW consulted with [facility] and she will connect this family with [place].  [employer] LCSW [phone]

## 2015-11-23 NOTE — Care Management Note (Signed)
Case Management Note  Patient Details  Name: Jasmine Hays MRN: [id number] Date of Birth: June 09, 1930  Subjective/Objective:    [facility]                 Action/Plan: Information faxed to [phone]. Spoke with Chasity informed her information being sent for physical therapy evaluation.    Expected Discharge Date:                  Expected Discharge Plan:     In-House Referral:     Discharge planning Services     Post Acute Care Choice:    Choice offered to:     DME Arranged:    DME Agency:     HH Arranged:    [place] Agency:     Status of Service:     Medicare Important Message Given:    Date Medicare IM Given:    Medicare IM give by:    Date Additional Medicare IM Given:    Additional Medicare Important Message give by:     If discussed at [place] of Stay Meetings, dates discussed:    Additional Comments:  Ival Bible, RN 11/23/2015, 5:59 PM

## 2015-11-23 NOTE — ED Notes (Signed)
Secretary attempted to reach pt to find out when they would be doing the consult. They have left for the day. Dr made aware.

## 2015-11-23 NOTE — NC FL2 (Deleted)
  [place] LEVEL OF CARE SCREENING TOOL     IDENTIFICATION  Patient Name: Jasmine Hays Birthdate: 1930/05/31 Sex: female Admission Date (Current Location): 11/23/2015  [place] and [place] Number:  Engineering geologist and Address:  [facility], [address] [place], [place] [address]      Provider Number: [id number]  Attending Physician Name and Address:  Harvest Dark, MD  Relative Name and Phone Number:       Current Level of Care: Hospital Recommended Level of Care: [place] Prior Approval Number:    Date Approved/Denied:   PASRR Number:    Discharge Plan: SNF    Current Diagnoses: There are no active problems to display for this patient.   Orientation RESPIRATION BLADDER Height & Weight     Self, Time, Situation, Place  Normal Incontinent Weight: 165 lb (74.844 kg) Height:  5\' 5"  (165.1 cm)  BEHAVIORAL SYMPTOMS/MOOD NEUROLOGICAL BOWEL NUTRITION STATUS      Continent Diet (Normal)  AMBULATORY STATUS COMMUNICATION OF NEEDS Skin   Extensive Assist Verbally Normal                       Personal Care Assistance Level of Assistance  Bathing, Feeding, Dressing, Total care Bathing Assistance: Maximum assistance Feeding assistance: Independent Dressing Assistance: Maximum assistance Total Care Assistance: Maximum assistance   Functional Limitations Info  Sight, Hearing, Speech Sight Info: Impaired (Right eye blind) Hearing Info: Adequate Speech Info: Adequate    SPECIAL CARE FACTORS FREQUENCY                       Contractures      Additional Factors Info  Code Status Code Status Info: full             Current Medications (11/23/2015):  This is the current hospital active medication list No current facility-administered medications for this encounter.   Current Outpatient Prescriptions  Medication Sig Dispense Refill  . HYDROcodone-acetaminophen (NORCO/VICODIN) 5-325 MG  tablet Take 0.5 tablets by mouth every 6 (six) hours as needed for moderate pain. 4 tablet 0     Discharge Medications: Please see discharge summary for a list of discharge medications.  Relevant Imaging Results:  Relevant Lab Results:   Additional Information SSN [id number]  Joana Reamer, [place]

## 2015-11-23 NOTE — NC FL2 (Signed)
  [place] LEVEL OF CARE SCREENING TOOL     IDENTIFICATION  Patient Name: Jasmine Hays Birthdate: 18-Jun-1930 Sex: female Admission Date (Current Location): 11/23/2015  [place] and [place] Number:  Engineering geologist and Address:  [facility], [address] [place], [place] [address]      Provider Number: [id number]  Attending Physician Name and Address:  Harvest Dark, MD  Relative Name and Phone Number:       Current Level of Care: Hospital Recommended Level of Care: [place] Prior Approval Number:    Date Approved/Denied:   PASRR Number:   [id number]   Discharge Plan: SNF    Current Diagnoses: There are no active problems to display for this patient.   Orientation RESPIRATION BLADDER Height & Weight     Self, Time, Situation, Place  Normal Incontinent Weight: 165 lb (74.844 kg) Height:  5\' 5"  (165.1 cm)  BEHAVIORAL SYMPTOMS/MOOD NEUROLOGICAL BOWEL NUTRITION STATUS      Continent Diet (Normal)  AMBULATORY STATUS COMMUNICATION OF NEEDS Skin   Extensive Assist Verbally Normal                       Personal Care Assistance Level of Assistance  Bathing, Feeding, Dressing, Total care Bathing Assistance: Maximum assistance Feeding assistance: Independent Dressing Assistance: Maximum assistance Total Care Assistance: Maximum assistance   Functional Limitations Info  Sight, Hearing, Speech Sight Info: Impaired (Right eye blind) Hearing Info: Adequate Speech Info: Adequate    SPECIAL CARE FACTORS FREQUENCY                       Contractures      Additional Factors Info  Code Status Code Status Info: full             Current Medications (11/23/2015):  This is the current hospital active medication list No current facility-administered medications for this encounter.   Current Outpatient Prescriptions  Medication Sig Dispense Refill  . HYDROcodone-acetaminophen  (NORCO/VICODIN) 5-325 MG tablet Take 0.5 tablets by mouth every 6 (six) hours as needed for moderate pain. 4 tablet 0     Discharge Medications: Please see discharge summary for a list of discharge medications.  Relevant Imaging Results:  Relevant Lab Results:   Additional Information SSN [id number]  Joana Reamer, [place]

## 2015-12-04 ENCOUNTER — Other Ambulatory Visit: Payer: Self-pay | Admitting: Specialist

## 2015-12-04 DIAGNOSIS — M545 Low back pain: Secondary | ICD-10-CM

## 2015-12-24 ENCOUNTER — Ambulatory Visit
Admission: RE | Admit: 2015-12-24 | Discharge: 2015-12-24 | Disposition: A | Discharge status: Home / Self Care | Payer: Medicare PPO | Source: Ambulatory Visit | Attending: Specialist | Admitting: Specialist

## 2015-12-24 DIAGNOSIS — M4806 Spinal stenosis, lumbar region: Secondary | ICD-10-CM | POA: Diagnosis not present

## 2015-12-24 DIAGNOSIS — M545 Low back pain: Secondary | ICD-10-CM | POA: Diagnosis present

## 2015-12-24 DIAGNOSIS — M5126 Other intervertebral disc displacement, lumbar region: Secondary | ICD-10-CM | POA: Diagnosis not present

## 2016-02-17 ENCOUNTER — Other Ambulatory Visit: Payer: Self-pay | Admitting: Internal Medicine

## 2016-02-17 DIAGNOSIS — Z1231 Encounter for screening mammogram for malignant neoplasm of breast: Secondary | ICD-10-CM

## 2016-03-08 ENCOUNTER — Ambulatory Visit: Payer: Medicare PPO

## 2016-03-29 ENCOUNTER — Ambulatory Visit: Payer: Medicare PPO | Attending: Internal Medicine

## 2016-06-04 ENCOUNTER — Ambulatory Visit: Payer: Medicare PPO | Attending: Internal Medicine

## 2017-02-15 DIAGNOSIS — E119 Type 2 diabetes mellitus without complications: Secondary | ICD-10-CM | POA: Diagnosis not present

## 2017-02-15 DIAGNOSIS — R5381 Other malaise: Secondary | ICD-10-CM | POA: Diagnosis not present

## 2017-02-15 DIAGNOSIS — I10 Essential (primary) hypertension: Secondary | ICD-10-CM | POA: Diagnosis not present

## 2017-02-15 DIAGNOSIS — E784 Other hyperlipidemia: Secondary | ICD-10-CM | POA: Diagnosis not present

## 2017-04-03 IMAGING — CR DG LUMBAR SPINE COMPLETE 4+V
5 series · 5 of 5 positions shown · non-contrast
Comparison: Lumbar spine MR dated 11/14/2013.

CLINICAL DATA: Low back right hip pain. Multiple recent falls,
including to this morning. Right leg sciatic pain.

EXAM:
LUMBAR SPINE - COMPLETE 4+ VIEW

[l-spine ap]
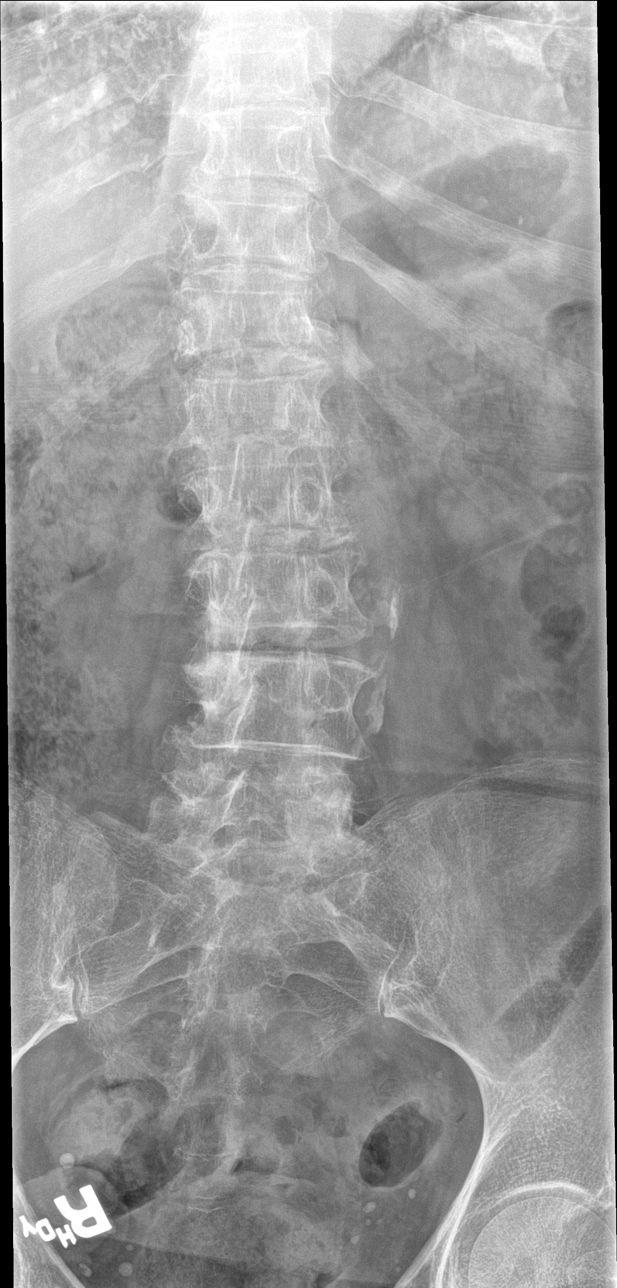

[l-spine obl (1 of 2)]
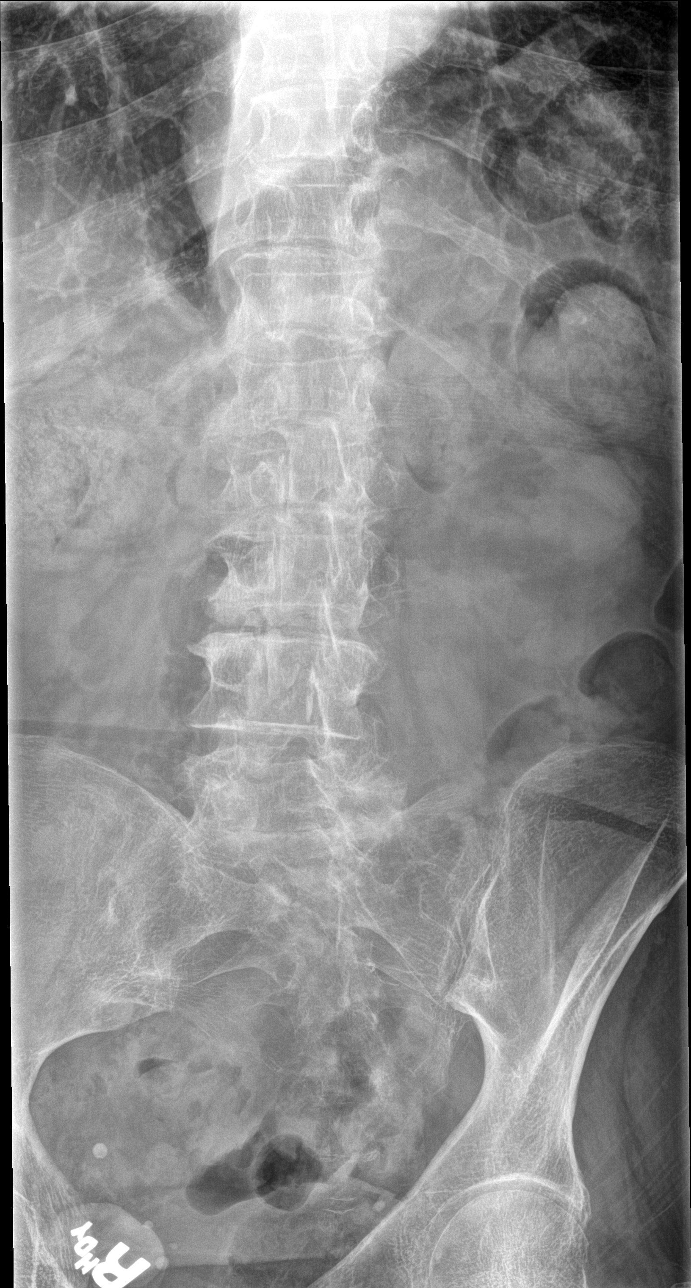

[l-spine obl (2 of 2)]
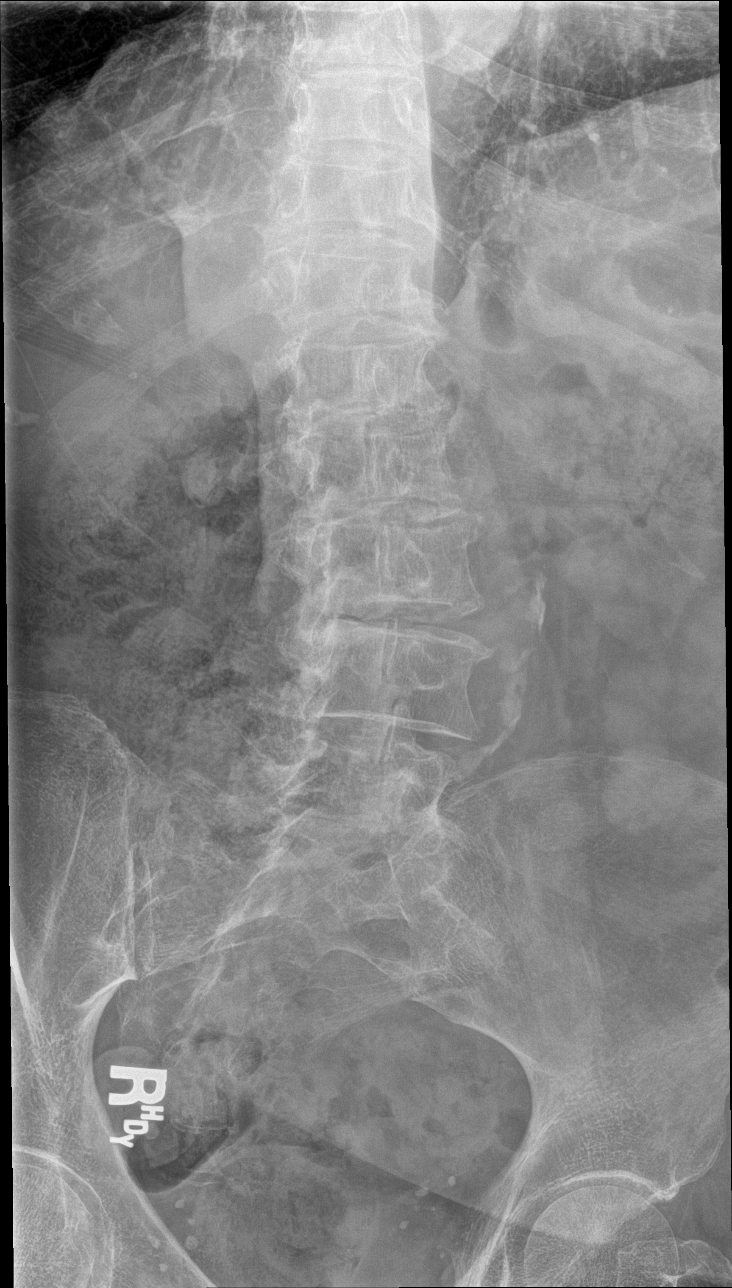

[l-spine lat]
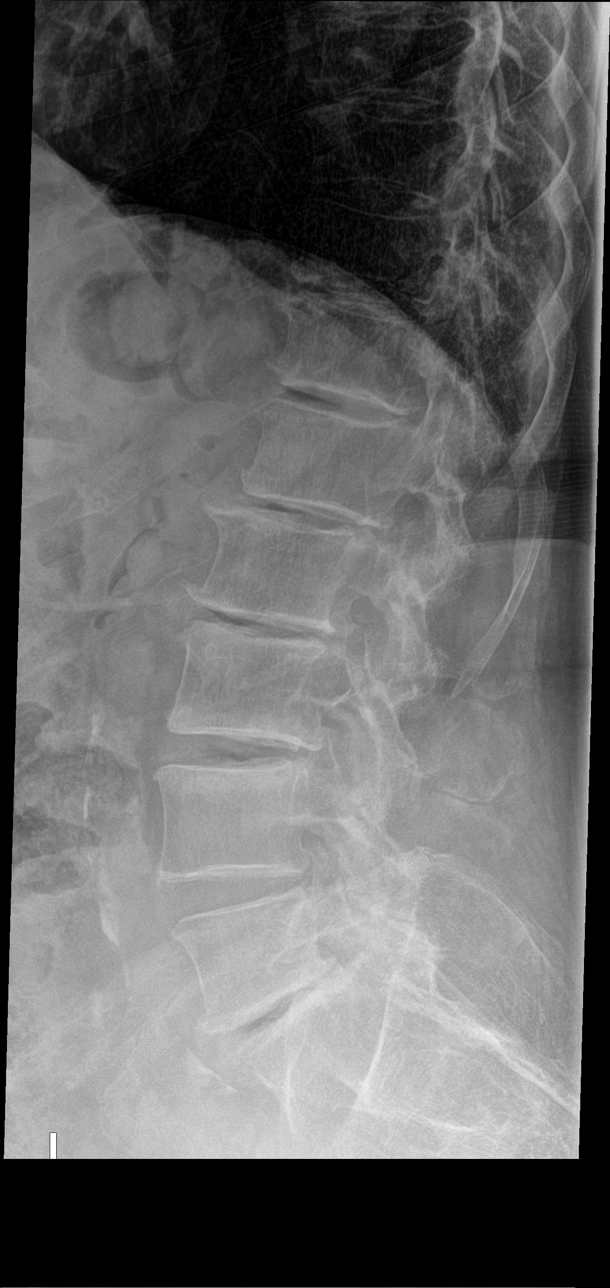

[l-spine spot]
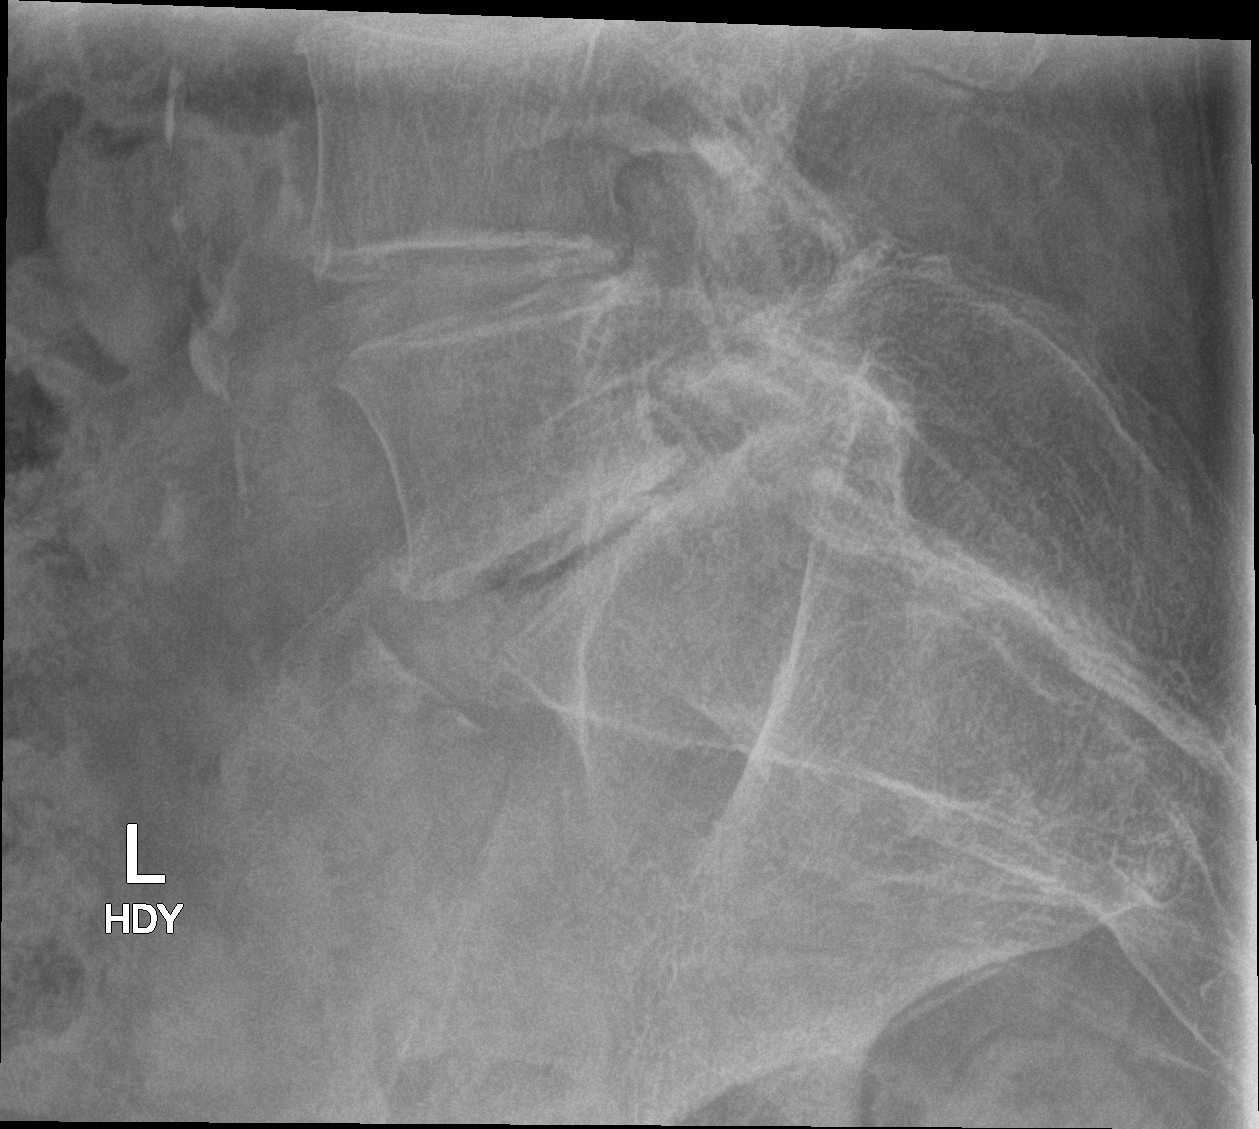

[5 of 5 positions shown; findings below may reference images not displayed]

FINDINGS: Five non-rib-bearing lumbar vertebrae. Mild scoliosis. Disc space
narrowing, vacuum phenomenon and spur formation at multiple levels.
Facet degenerative changes in the lower lumbar spine. No fractures,
pars defects or acute subluxations. Atheromatous arterial
calcifications.
IMPRESSION: No fracture or acute subluxation. Lumbar and lower thoracic spine
degenerative changes and mild scoliosis.

## 2017-04-03 IMAGING — CT CT HEAD W/O CM
1 of 2 series · 13 of 30 positions shown, 17 images · non-contrast
Comparison: CT scan of November 14, 2013.

CLINICAL DATA: Multiple falls recently.  No loss of consciousness.

EXAM:
CT HEAD WITHOUT CONTRAST
TECHNIQUE: Contiguous axial images were obtained from the base of the skull
through the vertex without intravenous contrast.

[Series 2: head wo · axial · 0.44mm/px · z∈[-176,-50]mm · 13 of 31 slices shown, 17 images]
[im 3/31  brain]
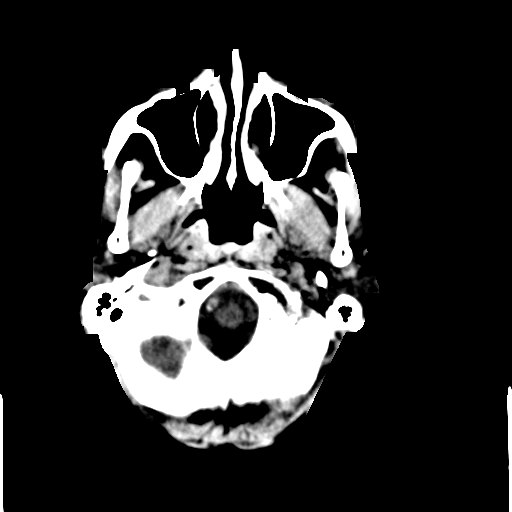
[im 3/31  bone]
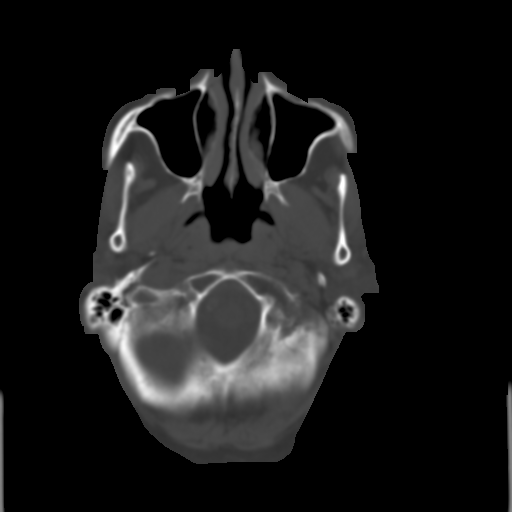
[im 5/31  brain]
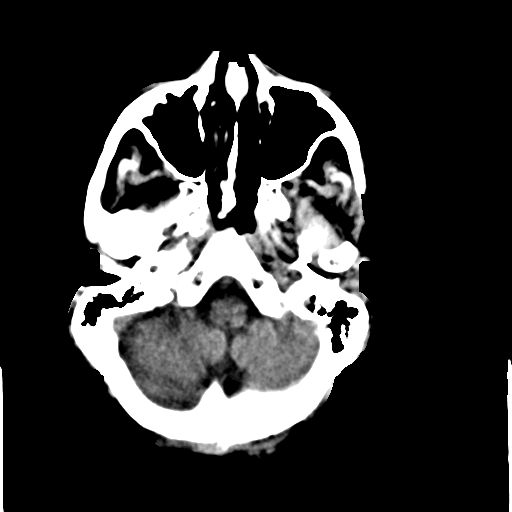
[im 7/31  brain]
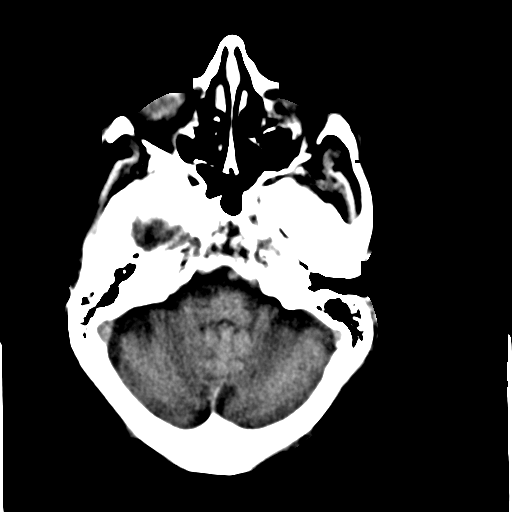
[im 9/31  brain]
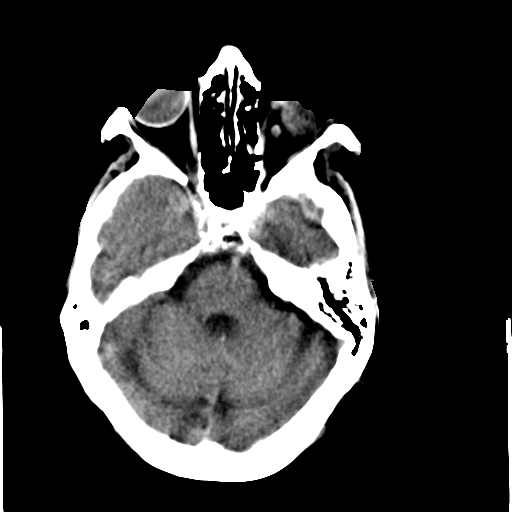
[im 11/31  brain]
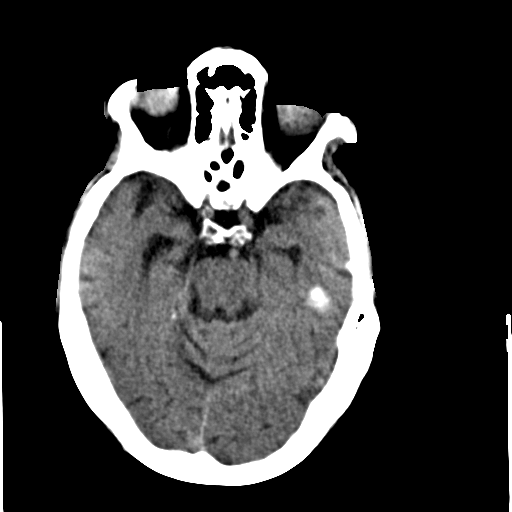
[im 11/31  bone]
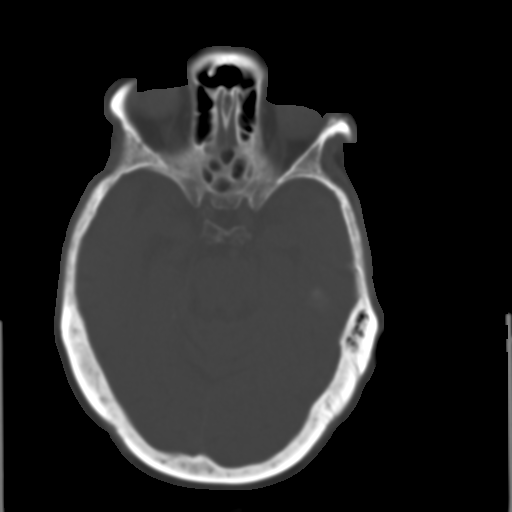
[im 13/31  brain]
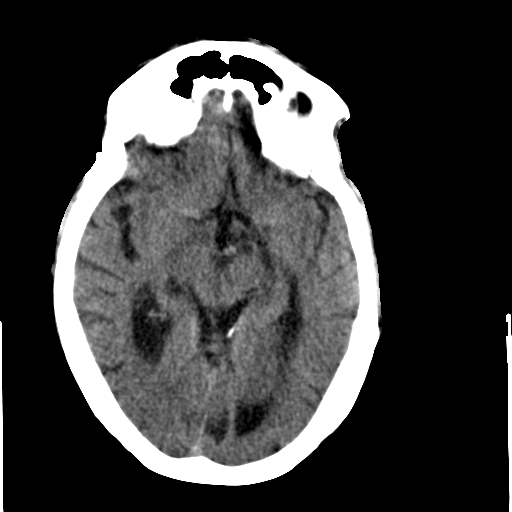
[im 16/31  brain]
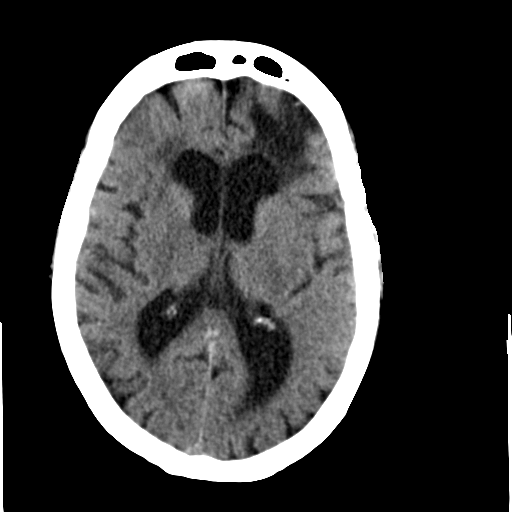
[im 18/31  brain]
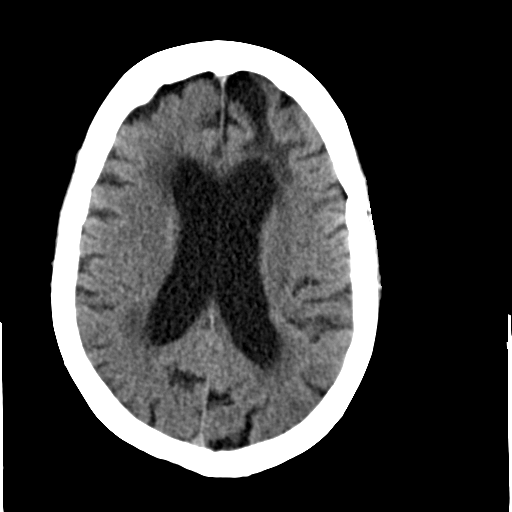
[im 20/31  brain]
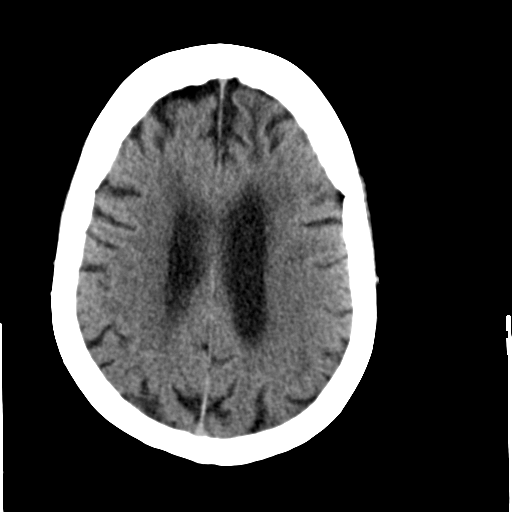
[im 20/31  bone]
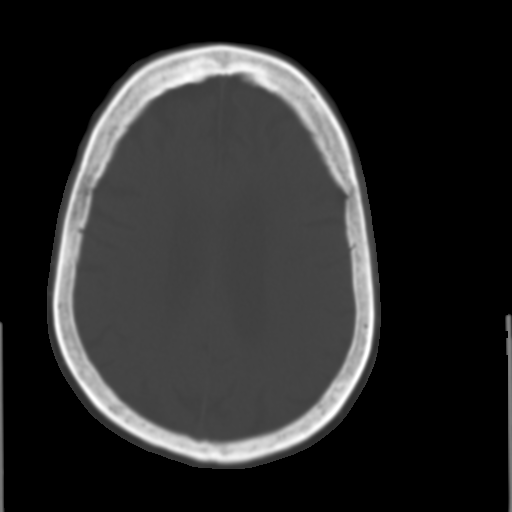
[im 22/31  brain]
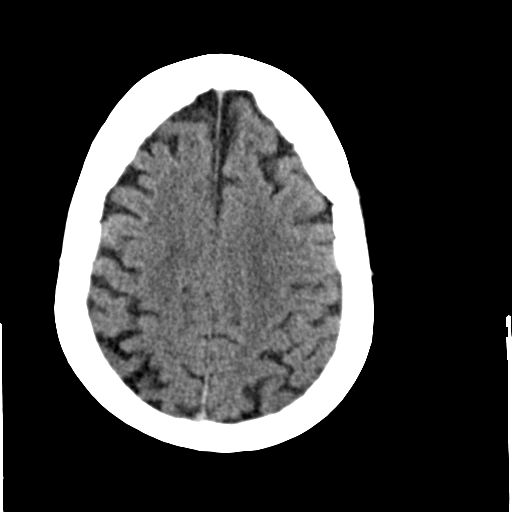
[im 24/31  brain]
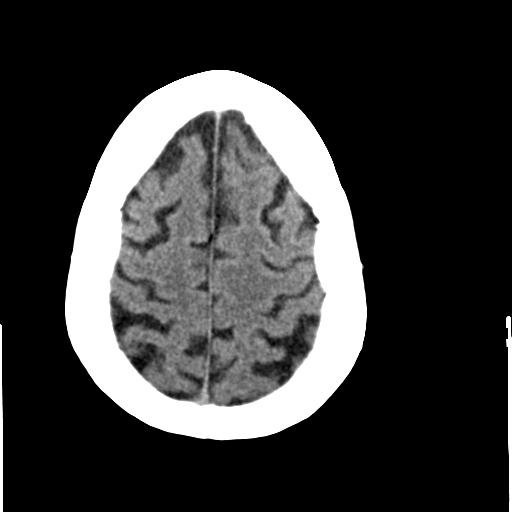
[im 26/31  brain]
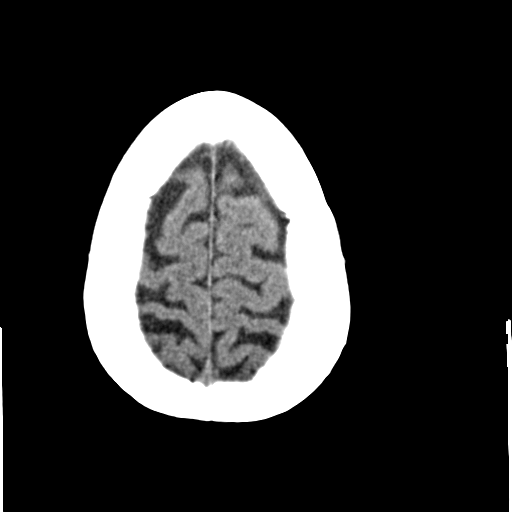
[im 28/31  brain]
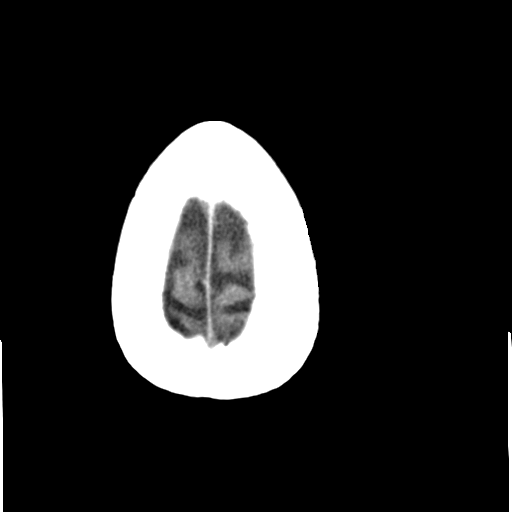
[im 28/31  bone]
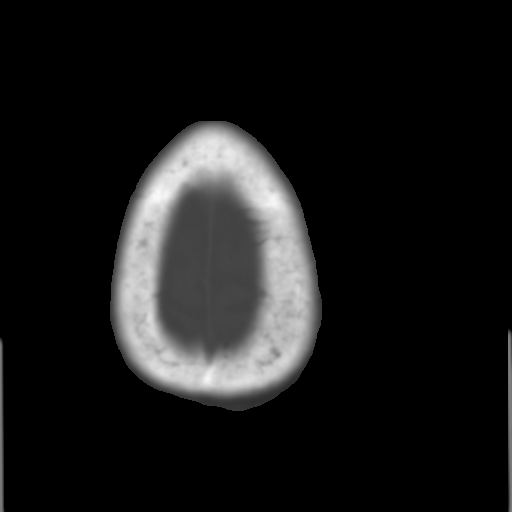

[13 of 30 positions shown; findings below may reference images not displayed]

FINDINGS: Bony calvarium appears intact. Left frontal encephalomalacia is
noted consistent with old infarction. Minimal chronic ischemic white
matter disease is noted. No mass effect or midline shift is noted.
Ventricular size is within normal limits. There is no evidence of
mass lesion, hemorrhage or acute infarction.
IMPRESSION: Old left frontal infarction. Minimal chronic ischemic white matter
disease. No acute intracranial abnormality seen.

## 2017-07-14 DIAGNOSIS — I10 Essential (primary) hypertension: Secondary | ICD-10-CM | POA: Diagnosis not present

## 2017-07-14 DIAGNOSIS — M545 Low back pain: Secondary | ICD-10-CM | POA: Diagnosis not present

## 2017-07-14 DIAGNOSIS — J449 Chronic obstructive pulmonary disease, unspecified: Secondary | ICD-10-CM | POA: Diagnosis not present

## 2017-07-14 DIAGNOSIS — Z853 Personal history of malignant neoplasm of breast: Secondary | ICD-10-CM | POA: Diagnosis not present

## 2017-07-27 DIAGNOSIS — Z853 Personal history of malignant neoplasm of breast: Secondary | ICD-10-CM | POA: Diagnosis not present

## 2017-07-27 DIAGNOSIS — J449 Chronic obstructive pulmonary disease, unspecified: Secondary | ICD-10-CM | POA: Diagnosis not present

## 2017-07-27 DIAGNOSIS — I10 Essential (primary) hypertension: Secondary | ICD-10-CM | POA: Diagnosis not present

## 2017-07-27 DIAGNOSIS — M545 Low back pain: Secondary | ICD-10-CM | POA: Diagnosis not present

## 2017-08-05 DIAGNOSIS — J449 Chronic obstructive pulmonary disease, unspecified: Secondary | ICD-10-CM | POA: Diagnosis not present

## 2017-08-05 DIAGNOSIS — I10 Essential (primary) hypertension: Secondary | ICD-10-CM | POA: Diagnosis not present

## 2017-08-05 DIAGNOSIS — M545 Low back pain: Secondary | ICD-10-CM | POA: Diagnosis not present

## 2017-08-05 DIAGNOSIS — Z853 Personal history of malignant neoplasm of breast: Secondary | ICD-10-CM | POA: Diagnosis not present

## 2017-08-10 DIAGNOSIS — I10 Essential (primary) hypertension: Secondary | ICD-10-CM | POA: Diagnosis not present

## 2017-08-10 DIAGNOSIS — M545 Low back pain: Secondary | ICD-10-CM | POA: Diagnosis not present

## 2017-08-10 DIAGNOSIS — J449 Chronic obstructive pulmonary disease, unspecified: Secondary | ICD-10-CM | POA: Diagnosis not present

## 2017-08-10 DIAGNOSIS — Z853 Personal history of malignant neoplasm of breast: Secondary | ICD-10-CM | POA: Diagnosis not present

## 2017-08-19 DIAGNOSIS — J449 Chronic obstructive pulmonary disease, unspecified: Secondary | ICD-10-CM | POA: Diagnosis not present

## 2017-08-19 DIAGNOSIS — M545 Low back pain: Secondary | ICD-10-CM | POA: Diagnosis not present

## 2017-08-19 DIAGNOSIS — Z853 Personal history of malignant neoplasm of breast: Secondary | ICD-10-CM | POA: Diagnosis not present

## 2017-08-19 DIAGNOSIS — I10 Essential (primary) hypertension: Secondary | ICD-10-CM | POA: Diagnosis not present

## 2017-09-07 DIAGNOSIS — J449 Chronic obstructive pulmonary disease, unspecified: Secondary | ICD-10-CM | POA: Diagnosis not present

## 2017-09-07 DIAGNOSIS — Z853 Personal history of malignant neoplasm of breast: Secondary | ICD-10-CM | POA: Diagnosis not present

## 2017-09-07 DIAGNOSIS — M545 Low back pain: Secondary | ICD-10-CM | POA: Diagnosis not present

## 2017-09-07 DIAGNOSIS — I10 Essential (primary) hypertension: Secondary | ICD-10-CM | POA: Diagnosis not present

## 2018-03-09 DIAGNOSIS — H353112 Nonexudative age-related macular degeneration, right eye, intermediate dry stage: Secondary | ICD-10-CM | POA: Diagnosis not present

## 2018-03-09 DIAGNOSIS — Z961 Presence of intraocular lens: Secondary | ICD-10-CM | POA: Diagnosis not present

## 2018-03-09 DIAGNOSIS — H00025 Hordeolum internum left lower eyelid: Secondary | ICD-10-CM | POA: Diagnosis not present

## 2018-07-25 DIAGNOSIS — L03115 Cellulitis of right lower limb: Secondary | ICD-10-CM | POA: Diagnosis not present

## 2018-07-25 DIAGNOSIS — C50919 Malignant neoplasm of unspecified site of unspecified female breast: Secondary | ICD-10-CM | POA: Diagnosis not present

## 2018-08-28 ENCOUNTER — Encounter: Payer: Medicare Other | Attending: Family Medicine | Admitting: Family Medicine

## 2018-08-28 DIAGNOSIS — J449 Chronic obstructive pulmonary disease, unspecified: Secondary | ICD-10-CM | POA: Diagnosis not present

## 2018-08-28 DIAGNOSIS — I739 Peripheral vascular disease, unspecified: Secondary | ICD-10-CM | POA: Diagnosis not present

## 2018-08-28 DIAGNOSIS — S80811A Abrasion, right lower leg, initial encounter: Secondary | ICD-10-CM | POA: Diagnosis not present

## 2018-08-28 DIAGNOSIS — F17200 Nicotine dependence, unspecified, uncomplicated: Secondary | ICD-10-CM | POA: Diagnosis not present

## 2018-08-28 DIAGNOSIS — X58XXXA Exposure to other specified factors, initial encounter: Secondary | ICD-10-CM | POA: Diagnosis not present

## 2018-08-28 DIAGNOSIS — Z853 Personal history of malignant neoplasm of breast: Secondary | ICD-10-CM | POA: Insufficient documentation

## 2018-08-28 DIAGNOSIS — E1151 Type 2 diabetes mellitus with diabetic peripheral angiopathy without gangrene: Secondary | ICD-10-CM | POA: Insufficient documentation

## 2018-08-28 DIAGNOSIS — I10 Essential (primary) hypertension: Secondary | ICD-10-CM | POA: Diagnosis not present

## 2018-08-28 DIAGNOSIS — F329 Major depressive disorder, single episode, unspecified: Secondary | ICD-10-CM | POA: Diagnosis not present

## 2018-08-28 DIAGNOSIS — G2581 Restless legs syndrome: Secondary | ICD-10-CM | POA: Diagnosis not present

## 2018-08-28 DIAGNOSIS — F419 Anxiety disorder, unspecified: Secondary | ICD-10-CM | POA: Insufficient documentation

## 2018-08-28 DIAGNOSIS — F0390 Unspecified dementia without behavioral disturbance: Secondary | ICD-10-CM | POA: Diagnosis not present

## 2018-08-28 NOTE — Progress Notes (Signed)
[name], Jasmine Hays ([id number]) Visit Report for 08/28/2018 Abuse/Suicide Risk Screen Details Patient Name: Jasmine, Hays Date of Service: 08/28/2018 1:00 PM Medical Record Number: [id number] Patient Account Number: [id number] Date of Birth/Sex: 26-Jan-1930 (85 y.o. F) Treating RN: Jasmine Hays Primary Care Jasmine Hays: Jasmine Hays Other Clinician: Referring Jasmine Hays: Jasmine Hays Treating Sandy Blouch/Extender: Jasmine Hays in Treatment: 0 Abuse/Suicide Risk Screen Items Answer ABUSE/SUICIDE RISK SCREEN: Has anyone close to you tried to hurt or harm you recentlyo No Do you feel uncomfortable with anyone in your familyo No Has anyone forced you do things that you didnot want to doo No Do you have any thoughts of harming yourselfo No Patient displays signs or symptoms of abuse and/or neglect. No Electronic Signature(s) Signed: 08/28/2018 2:34:20 PM By: Jasmine Hays Entered By: Jasmine Hays on 08/28/2018 13:34:13 Duval, Jasmine Hays ([id number]) -------------------------------------------------------------------------------- Activities of Daily Living Details Patient Name: Jasmine, Hays. Date of Service: 08/28/2018 1:00 PM Medical Record Number: [id number] Patient Account Number: [id number] Date of Birth/Sex: Mar 04, 1930 (85 y.o. F) Treating RN: Jasmine Hays Primary Care Jasmine Hays: Jasmine Hays Other Clinician: Referring Jasmine Hays: Jasmine Hays Treating Jasmine Hays/Extender: Jasmine Hays in Treatment: 0 Activities of Daily Living Items Answer Activities of Daily Living (Please select one for each item) Drive Automobile Not Able Take Medications Completely Able Use Telephone Completely Able Care for Appearance Completely Able Use Toilet Completely Able Bath / Shower Completely Able Dress Self Completely Able Feed Self Completely Able Walk Completely Able Get In / Out Bed Completely Able Housework Need Assistance Prepare Meals Completely [place] Completely Able Shop  for Self Completely Able Electronic Signature(s) Signed: 08/28/2018 2:34:20 PM By: Jasmine Hays Entered By: Jasmine Hays on 08/28/2018 13:35:00 Torosyan, Jasmine Hays ([id number]) -------------------------------------------------------------------------------- Education Assessment Details Patient Name: Jasmine Hays Date of Service: 08/28/2018 1:00 PM Medical Record Number: [id number] Patient Account Number: [id number] Date of Birth/Sex: 04/07/1930 (85 y.o. F) Treating RN: Jasmine Hays Primary Care Jasmine Hays: Jasmine Hays Other Clinician: Referring Kenora Spayd: Jasmine Hays Treating Rayshawn Maney/Extender: Jasmine Hays in Treatment: 0 Primary Learner Assessed: Patient Learning Preferences/Education Level/Primary Language Learning Preference: Explanation, Demonstration Highest Education Level: College or Above Preferred Language: English Cognitive Barrier Assessment/Beliefs Language Barrier: No Translator Needed: No Memory Deficit: No Emotional Barrier: No Cultural/Religious Beliefs Affecting Medical Care: No Physical Barrier Assessment Impaired Vision: Yes Glasses, artificial left eye Decreased Hand dexterity: No Knowledge/Comprehension Assessment Knowledge Level: High Comprehension Level: High Ability to understand written High instructions: Ability to understand verbal High instructions: Motivation Assessment Anxiety Level: Calm Cooperation: Cooperative Education Importance: Acknowledges Need Interest in Health Problems: Asks Questions Perception: Coherent Willingness to Engage in Self- High Management Activities: Readiness to Engage in Self- High Management Activities: Electronic Signature(s) Signed: 08/28/2018 2:34:20 PM By: Jasmine Hays Entered By: Jasmine Hays on 08/28/2018 13:35:59 Mattioli, Jasmine Hays ([id number]) -------------------------------------------------------------------------------- Fall Risk Assessment Details Patient Name: Jasmine Hays Date of Service:  08/28/2018 1:00 PM Medical Record Number: [id number] Patient Account Number: [id number] Date of Birth/Sex: 01-02-1930 (85 y.o. F) Treating RN: Jasmine Hays Primary Care Karron Alvizo: Jasmine Hays Other Clinician: Referring Jasmine Hays: Jasmine Hays Treating Charrisse Masley/Extender: Jasmine Hays in Treatment: 0 Fall Risk Assessment Items Have you had 2 or more falls in the last 12 monthso 0 No Have you had any fall that resulted in injury in the last 12 monthso 0 No FALL RISK ASSESSMENT: History of falling - immediate or within 3 months 0 No Secondary diagnosis 0 No Ambulatory aid None/bed rest/wheelchair/nurse 0 No Crutches/cane/walker 15 Yes Furniture 0 No IV  Access/Saline Lock 0 No Gait/Training Normal/bed rest/immobile 0 No Weak 0 No Impaired 0 No Mental Status Oriented to own ability 0 No Electronic Signature(s) Signed: 08/28/2018 2:34:20 PM By: Jasmine Hays Entered By: Jasmine Hays on 08/28/2018 13:36:40 Bott, Jasmine Hays ([id number]) -------------------------------------------------------------------------------- Foot Assessment Details Patient Name: Jasmine Hays Date of Service: 08/28/2018 1:00 PM Medical Record Number: [id number] Patient Account Number: [id number] Date of Birth/Sex: [birth year] (85 y.o. F) Treating RN: Jasmine Hays Primary Care Aston Lawhorn: Jasmine Hays Other Clinician: Referring Bodey Frizell: Jasmine Hays Treating Nil Bolser/Extender: Jasmine Hays in Treatment: 0 Foot Assessment Items Site Locations + = Sensation present, - = Sensation absent, C = Callus, U = Ulcer R = Redness, W = Warmth, M = Maceration, PU = Pre-ulcerative lesion F = Fissure, S = Swelling, D = Dryness Assessment Right: Left: Other Deformity: No No Prior Foot Ulcer: No No Prior Amputation: No No Charcot Joint: No No Ambulatory Status: Ambulatory Without Help Gait: Steady Electronic Signature(s) Signed: 08/28/2018 2:34:20 PM By: Jasmine Hays Entered By: Jasmine Hays on 08/28/2018  13:38:24 Truex, Jasmine Hays ([id number]) -------------------------------------------------------------------------------- Nutrition Risk Assessment Details Patient Name: Jasmine Hays Date of Service: 08/28/2018 1:00 PM Medical Record Number: [id number] Patient Account Number: [id number] Date of Birth/Sex: Aug 30, 1929 (85 y.o. F) Treating RN: Jasmine Hays Primary Care Khristian Phillippi: Jasmine Hays Other Clinician: Referring Beyonca Wisz: Jasmine Hays Treating Adea Geisel/Extender: Beather Arbour Weeks in Treatment: 0 Height (in): 66 Weight (lbs): 200 Body Mass Index (BMI): 32.3 Nutrition Risk Assessment Items NUTRITION RISK SCREEN: I have an illness or condition that made me change the kind and/or amount of 0 No food I eat I eat fewer than two meals per day 0 No I eat few fruits and vegetables, or milk products 0 No I have three or more drinks of beer, liquor or wine almost every day 0 No I have tooth or mouth problems that make it hard for me to eat 0 No I don't always have enough money to buy the food I need 0 No I eat alone most of the time 0 No I take three or more different prescribed or over-the-counter drugs a day 0 No Without wanting to, I have lost or gained 10 pounds in the last six months 0 No I am not always physically able to shop, cook and/or feed myself 0 No Nutrition Protocols Good Risk Protocol 0 No interventions needed Moderate Risk Protocol Electronic Signature(s) Signed: 08/28/2018 2:34:20 PM By: Jasmine Hays Entered By: Jasmine Hays on 08/28/2018 13:37:07

## 2018-08-31 NOTE — Progress Notes (Signed)
IISHA, SOYARS ([id number]) Visit Report for 08/28/2018 Chief Complaint Document Details Patient Name: Jasmine Hays, Jasmine Hays Date of Service: 08/28/2018 1:00 PM Medical Record Number: [id number] Patient Account Number: [id number] Date of Birth/Sex: 1930/04/10 (85 y.o. F) Treating RN: Harold Barban Primary Care Provider: [name] Other Clinician: Referring Provider: [name] Treating Provider/Extender: Beather Arbour Weeks in Treatment: 0 Information Obtained from: Patient Chief Complaint Right LE abrasion Electronic Signature(s) Signed: 08/29/2018 11:59:39 PM By: Beather Arbour FNP-C Entered By: Beather Arbour on 08/28/2018 13:41:11 Suell, [name] ([id number]) -------------------------------------------------------------------------------- HPI Details Patient Name: Jasmine Hays Date of Service: 08/28/2018 1:00 PM Medical Record Number: [id number] Patient Account Number: [id number] Date of Birth/Sex: [birth year] (85 y.o. F) Treating RN: Harold Barban Primary Care Provider: [name] Other Clinician: Referring Provider: [name] Treating Provider/Extender: Oneida Arenas in Treatment: 0 History of Present Illness HPI Description: 85 year old female with history of HTN, dementia, depression, and restless leg syndrome seen today for abrasion of right LE. Referred from PCP for possible right LE wound. Approximately one month ago she had a wound to the right LE. Per daughter, Ms. Ritsema was treated with an antibiotic. The right leg would healed after a couple of weeks. She later scratched the old wound site causing a small area opened. Today there appears to be no opening to the old wound area. She is complaining of bilateral LE discomfort with Increased edema bilateral LE, right greater than left. Ambulate independently. Followed by vein and vascular approx 6 yrs ago. Active 1 1/2 to 2 pack a day smoker. Denies recent falls. Per daughter, she will not keep any bandage  or wound dressing over area. She has used compression device in the past, however daughter states no compression use recently. Denies any recent fever, chills, SOB. Will refer to vein and vascular at request of daughter and for further manage of vascular function. Electronic Signature(s) Signed: 08/29/2018 11:59:39 PM By: Beather Arbour FNP-C Entered By: Beather Arbour on 08/28/2018 22:17:04 Dilauro, [name] ([id number]) -------------------------------------------------------------------------------- Physical Exam Details Patient Name: Jasmine Hays Date of Service: 08/28/2018 1:00 PM Medical Record Number: [id number] Patient Account Number: [id number] Date of Birth/Sex: [birth year] (85 y.o. F) Treating RN: Harold Barban Primary Care Provider: [name] Other Clinician: Referring Provider: [name] Treating Provider/Extender: Beather Arbour Weeks in Treatment: 0 Constitutional appears in no distress. Eyes Conjunctivae clear. No discharge. Ears, Nose, Mouth, and Throat External ears and nose are within normal limits No lesions present.Marland Kitchen Respiratory Respiratory effort is easy and symmetric bilaterally. Rate is normal at rest and on room air.. Cardiovascular Pedal pulses palpable bilaterally.. Edema present in both extremities.. Gastrointestinal (GI) Abdomen is soft and non-distended without masses or tenderness. Bowel sounds active in all quadrants.. Musculoskeletal Gait and station stable.. Integumentary (Hair, Skin) Right lower leg abrasion. Psychiatric No evidence of depression, anxiety, or agitation. Calm, cooperative, and communicative. Appropriate interactions and affect.. Notes Patient referred from PCP with no active wound on exam today. Small abrasion on right lower leg as a result of her scratching her leg. She has been experiencing recent increased bilateral LE discomfort, itching, and tenderness with occasional pain. Followed by vein and vascular in the past,  no recent follow up. Daughter mentioned that she has compression device that has has not been using. Electronic Signature(s) Signed: 08/29/2018 11:59:39 PM By: Beather Arbour FNP-C Entered By: Beather Arbour on 08/28/2018 14:17:45 Ganim, [name] ([id number]) -------------------------------------------------------------------------------- Physician Orders Details Patient Name: Jasmine Hays Date of Service: 08/28/2018 1:00 PM Medical  Record Number: [id number] Patient Account Number: [id number] Date of Birth/Sex: 05/25/1930 (85 y.o. F) Treating RN: Harold Barban Primary Care Provider: [name] Other Clinician: Referring Provider: [name] Treating Provider/Extender: Oneida Arenas in Treatment: 0 Verbal / Phone Orders: No Diagnosis Coding Wound Cleansing o Cleanse wound with mild soap and water Primary Wound Dressing o Other: - Triple antibiotic ointment Secondary Dressing o Boardered Foam Dressing Consults o Vascular - Consult Bilateral lower extremities, due to complaints of lower extremity discomfort. Electronic Signature(s) Signed: 08/29/2018 11:59:39 PM By: Beather Arbour FNP-C Entered By: Beather Arbour on 08/28/2018 14:18:09 Muniz, [name] ([id number]) -------------------------------------------------------------------------------- Problem List Details Patient Name: Jasmine Hays Date of Service: 08/28/2018 1:00 PM Medical Record Number: [id number] Patient Account Number: [id number] Date of Birth/Sex: [birth year] (85 y.o. F) Treating RN: Harold Barban Primary Care Provider: [name] Other Clinician: Referring Provider: [name] Treating Provider/Extender: Beather Arbour Weeks in Treatment: 0 Active Problems ICD-10 Evaluated Encounter Code Description Active Date Today Diagnosis I73.9 Peripheral vascular disease, unspecified 08/28/2018 No Yes F41.9 Anxiety disorder, unspecified 08/28/2018 No Yes S80.811A Abrasion, right lower leg,  initial encounter 08/28/2018 No Yes Z72.0 Tobacco use 08/28/2018 No Yes Inactive Problems Resolved Problems Electronic Signature(s) Signed: 08/29/2018 11:59:39 PM By: Beather Arbour FNP-C Entered By: Beather Arbour on 08/28/2018 22:09:50 Taddei, [name] ([id number]) -------------------------------------------------------------------------------- Progress Note Details Patient Name: Jasmine Hays Date of Service: 08/28/2018 1:00 PM Medical Record Number: [id number] Patient Account Number: [id number] Date of Birth/Sex: 01/28/1930 (85 y.o. F) Treating RN: Harold Barban Primary Care Provider: [name] Other Clinician: Referring Provider: [name] Treating Provider/Extender: Oneida Arenas in Treatment: 0 Subjective Chief Complaint Information obtained from Patient Right LE abrasion History of Present Illness (HPI) 85 year old female with history of HTN, dementia, depression, and restless leg syndrome seen today for abrasion of right LE. Referred from PCP for possible right LE wound. Approximately one month ago she had a wound to the right LE. Per daughter, Ms. Esau was treated with an antibiotic. The leg would healed after a couple of weeks. She later scratched the area and a small area opened. Today there appears to be no opening to the old wound area. She is complaining of bilateral LE discomfort. Increased edema bilateral LE, right greater than left. Ambulate independently. Followed by vein and vascular approx 6 yrs ago. Active 1 1/2 to 2 pack a day smoker. Denies recent increased pain. Per daughter, she will not keep any bandage or wound dressing over area. Denies any recent fever, chills, SOB. Will refer to vein and vascular as she has no active open wounds. Wound History Patient reportedly has not tested positive for osteomyelitis. Patient reportedly has had testing performed to evaluate circulation in the legs. Patient experiences the following problems associated  with their wounds: swelling. Patient History Information obtained from Patient. Allergies No allergies have been documented for the patient Family History Ens[place], Hypertension - Mother, Thyroid Problems - Child, No family history of Diabetes, Heart Disease, Hereditary Spherocytosis, Kidney Disease, Lung Disease, Seizures, Stroke, Tuberculosis. Social History Current every day smoker - pack a day - started on 06/28/1978, Marital Status - Widowed, Alcohol Use - Never, Drug Use - No History, Caffeine Use - Daily - coffee. Medical History Eyes Patient has history of Cataracts - removed Ear/Nose/Mouth/Throat Denies history of Chronic sinus problems/congestion, Middle ear problems Hematologic/Lymphatic Denies history of Anemia, Hemophilia, Human Immunodeficiency Virus, Lymphedema, Sickle Cell Disease Respiratory Patient has history of Chronic Obstructive Pulmonary Disease (COPD) - 5  years Denies history of Aspiration, Asthma, Pneumothorax, Sleep Apnea, Tuberculosis Cardiovascular Sowell, Antinette C. ([id number]) Denies history of Angina, Arrhythmia, Congestive Heart Failure, Coronary Artery Disease, Deep Vein Thrombosis, Hypertension, Hypotension, Myocardial Infarction, Peripheral Arterial Disease, Peripheral Venous Disease, Phlebitis, Vasculitis Gastrointestinal Denies history of Cirrhosis , Colitis, Crohn s, Hepatitis A, Hepatitis B, Hepatitis C Endocrine Denies history of Type I Diabetes, Type II Diabetes Genitourinary Denies history of End Stage Renal Disease Immunological Denies history of Lupus Erythematosus, Raynaud s, Scleroderma Integumentary (Skin) Denies history of History of Burn, History of pressure wounds Musculoskeletal Denies history of Gout, Rheumatoid Arthritis, Osteoarthritis, Osteomyelitis Neurologic Patient has history of Dementia - 06/28/2017 Denies history of Neuropathy, Quadriplegia, Paraplegia, Seizure Disorder Oncologic Patient has history of  Received Radiation - 18 years ago, breast cancer Denies history of Received Chemotherapy Psychiatric Denies history of Anorexia/bulimia, Confinement Anxiety Medical And Surgical History Notes 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 Eyes Complains or has symptoms of Glasses / Contacts - gasses. Denies complaints or symptoms of Dry Eyes, Vision Changes. Ear/Nose/Mouth/Throat Denies complaints or symptoms of Difficult clearing ears, Sinusitis. Hematologic/Lymphatic Denies complaints or symptoms of Bleeding / Clotting Disorders, Human Immunodeficiency Virus. Respiratory Complains or has symptoms of Chronic or frequent coughs - COPD, Shortness of Breath - COPD. Cardiovascular Complains or has symptoms of LE edema. Denies complaints or symptoms of Chest pain. Gastrointestinal Denies complaints or symptoms of Frequent diarrhea, Nausea, Vomiting. Endocrine Denies complaints or symptoms of Hepatitis, Thyroid disease, Polydypsia (Excessive Thirst). Genitourinary Denies complaints or symptoms of Kidney failure/ Dialysis, Incontinence/dribbling. Immunological Complains or has symptoms of Itching - swelling in LE. Denies complaints or symptoms of Hives. Integumentary (Skin) Complains or has symptoms of Swelling - LE. Denies complaints or symptoms of Wounds, Bleeding or bruising tendency, Breakdown. Musculoskeletal Denies complaints or symptoms of Muscle Pain, Muscle Weakness. Neurologic Denies complaints or symptoms of Numbness/parasthesias, Focal/Weakness. Oncologic Reist, Kera C. ([id number]) 18 years ago, pt had breast cancer in right breast Psychiatric Complains or has symptoms of Anxiety, Claustrophobia. Objective Constitutional appears in no distress. Vitals Time Taken: 1:15 PM, Height: 66 in, Source: Stated, Weight: 200 lbs, Source: Stated, BMI:  32.3, Temperature: 97.9 F, Pulse: 102 bpm, Respiratory Rate: 16 breaths/min, Blood Pressure: 181/86 mmHg. Eyes Conjunctivae clear. No discharge. Ears, Nose, Mouth, and Throat External ears and nose are within normal limits No lesions present.Marland Kitchen Respiratory Respiratory effort is easy and symmetric bilaterally. Rate is normal at rest and on room air.. Cardiovascular Pedal pulses palpable bilaterally.. Edema present in both extremities.. Gastrointestinal (GI) Abdomen is soft and non-distended without masses or tenderness. Bowel sounds active in all quadrants.. Musculoskeletal Gait and station stable.Marland Kitchen Psychiatric No evidence of depression, anxiety, or agitation. Calm, cooperative, and communicative. Appropriate interactions and affect.. General Notes: Patient referred from PCP with no active wound on exam today. Small abrasion on right lower leg as a result of her scratching her leg. She has been experiencing recent increased bilateral LE discomfort, itching, and tenderness with occasional pain. Followed by vein and vascular in the past, no recent follow up. Daughter mentioned that she has compression device that has has not been using. Integumentary (Hair, Skin) Right lower leg abrasion. Plan Wound Cleansing: Gatchel, Chalene C. ([id number]) Cleanse wound with mild soap and water Primary Wound Dressing: Other: - Triple antibiotic ointment Secondary Dressing: Boardered Foam Dressing Consults ordered were: Vascular - Consult Bilateral lower extremities, due to complaints of lower  extremity discomfort. Electronic Signature(s) Signed: 08/29/2018 11:59:39 PM By: Beather Arbour FNP-C Entered By: Beather Arbour on 08/28/2018 14:28:43 Empson, [name] ([id number]) -------------------------------------------------------------------------------- ROS/PFSH Details Patient Name: Jasmine Hays Date of Service: 08/28/2018 1:00 PM Medical Record Number: [id number] Patient Account Number:  [id number] Date of Birth/Sex: [birth year] (85 y.o. F) Treating RN: Army Melia Primary Care Provider: [name] Other Clinician: Referring Provider: [name] Treating Provider/Extender: Oneida Arenas in Treatment: 0 Information Obtained From Patient Wound History Do you currently have one or more open woundso No Have you tested positive for osteomyelitis (bone infection)o No Have you had any tests for circulation on your legso Yes Who ordered the [facility] Where was the test doneo 6 years ago Have you had other problems associated with your woundso Swelling Constitutional Symptoms (General Health) Complaints and Symptoms: Negative for: Fatigue; Fever; Chills; Marked Weight Change Eyes Complaints and Symptoms: Positive for: Glasses / Contacts - gasses Negative for: Dry Eyes; Vision Changes Medical History: Positive for: Cataracts - removed Past Medical History Notes: has artificial left eye Ear/Nose/Mouth/Throat Complaints and Symptoms: Negative for: Difficult clearing ears; Sinusitis Medical History: Negative for: Chronic sinus problems/congestion; Middle ear problems Hematologic/Lymphatic Complaints and Symptoms: Negative for: Bleeding / Clotting Disorders; Human Immunodeficiency Virus Medical History: Negative for: Anemia; Hemophilia; Human Immunodeficiency Virus; Lymphedema; Sickle Cell Disease Respiratory Complaints and Symptoms: Positive for: Chronic or frequent coughs - COPD; Shortness of Breath - COPD Medical History: Positive for: Chronic Obstructive Pulmonary Disease (COPD) - 5 years Alameda, Tionna C. ([id number]) Negative for: Aspiration; Asthma; Pneumothorax; Sleep Apnea; Tuberculosis Cardiovascular Complaints and Symptoms: Positive for: LE edema Negative for: Chest pain Medical History: Negative for: Angina; Arrhythmia; Congestive Heart Failure; Coronary Artery Disease; Deep Vein Thrombosis; Hypertension; Hypotension; Myocardial  Infarction; Peripheral Arterial Disease; Peripheral Venous Disease; Phlebitis; Vasculitis Gastrointestinal Complaints and Symptoms: Negative for: Frequent diarrhea; Nausea; Vomiting Medical History: Negative for: Cirrhosis ; Colitis; Crohnos; Hepatitis A; Hepatitis B; Hepatitis C Endocrine Complaints and Symptoms: Negative for: Hepatitis; Thyroid disease; Polydypsia (Excessive Thirst) Medical History: Negative for: Type I Diabetes; Type II Diabetes Genitourinary Complaints and Symptoms: Negative for: Kidney failure/ Dialysis; Incontinence/dribbling Medical History: Negative for: End Stage Renal Disease Immunological Complaints and Symptoms: Positive for: Itching - swelling in LE Negative for: Hives Medical History: Negative for: Lupus Erythematosus; Raynaudos; Scleroderma Integumentary (Skin) Complaints and Symptoms: Positive for: Swelling - LE Negative for: Wounds; Bleeding or bruising tendency; Breakdown Medical History: Negative for: History of Burn; History of pressure wounds Musculoskeletal Complaints and Symptoms: Negative for: Muscle Pain; Muscle Weakness Zayed, Sahvanna C. ([id number]) Medical History: Negative for: Gout; Rheumatoid Arthritis; Osteoarthritis; Osteomyelitis Neurologic Complaints and Symptoms: Negative for: Numbness/parasthesias; Focal/Weakness Medical History: Positive for: Dementia - 06/28/2017 Negative for: Neuropathy; Quadriplegia; Paraplegia; Seizure Disorder Psychiatric Complaints and Symptoms: Positive for: Anxiety; Claustrophobia Medical History: Negative for: Anorexia/bulimia; Confinement Anxiety Oncologic Complaints and Symptoms: Review of System Notes: 18 years ago, pt had breast cancer in right breast Medical History: Positive for: Received Radiation - 18 years ago, breast cancer Negative for: Received Chemotherapy HBO Extended History Items Eyes: Cataracts Immunizations Pneumococcal Vaccine: Received Pneumococcal Vaccination:  Yes Tetanus Vaccine: Last tetanus shot: 04/28/2018 Implantable Devices None Family and Social History Cancer: Yes - Mother,Father; Diabetes: No; Heart Disease: No; Hereditary Spherocytosis: No; Hypertension: Yes - Mother; Kidney Disease: No; Lung Disease: No; Seizures: No; Stroke: No; Thyroid Problems: Yes - Child; Tuberculosis: No; Current every day smoker - pack a day - started on 06/28/1978; Marital Status - Widowed; Alcohol Use: Never; Drug Use:  No History; Caffeine Use: Daily - coffee; Financial Concerns: No; Food, Clothing or Shelter Needs: No; Support System Lacking: No; Transportation Concerns: No; Advanced Directives: No; Do not resuscitate: No; Living Will: Yes; Medical Power of Attorney: Yes - Reina Fuse, daughter Electronic Signature(s) Signed: 08/28/2018 2:34:20 PM By: Army Melia Signed: 08/29/2018 11:59:39 PM By: Beather Arbour FNP-C Entered By: Army Melia on 08/28/2018 13:33:58 Nie, [name] ([id number]) Casasola, [name] ([id number]) -------------------------------------------------------------------------------- SuperBill Details Patient Name: Jasmine Hays Date of Service: 08/28/2018 Medical Record Number: [id number] Patient Account Number: [id number] Date of Birth/Sex: 06-16-1930 (85 y.o. F) Treating RN: Harold Barban Primary Care Provider: [name] Other Clinician: Referring Provider: [name] Treating Provider/Extender: Oneida Arenas in Treatment: 0 Diagnosis Coding ICD-10 Codes Code Description I73.9 Peripheral vascular disease, unspecified F41.9 Anxiety disorder, unspecified S80.811A Abrasion, right lower leg, initial encounter Z72.0 Tobacco use Facility Procedures CPT4 Code: [id number] Description: 99213 - WOUND CARE VISIT-LEV 3 EST PT Modifier: Quantity: 1 Physician Procedures CPT4 Code: [id number] Description: 22482 - WC PHYS LEVEL 2 - EST PT ICD-10 Diagnosis Description I73.9 Peripheral vascular disease, unspecified S80.811A Abrasion,  right lower leg, initial encounter Modifier: Quantity: 1 Electronic Signature(s) Signed: 08/29/2018 11:59:39 PM By: Beather Arbour FNP-C Entered By: Beather Arbour on 08/28/2018 22:18:12

## 2018-08-31 NOTE — Progress Notes (Signed)
Jasmine Hays ([id number]) Visit Report for 08/28/2018 Allergy List Details Patient Name: Jasmine Hays Date of Service: 08/28/2018 1:00 PM Medical Record Number: [id number] Patient Account Number: [id number] Date of Birth/Sex: 16-Jun-1930 (85 y.o. F) Treating RN: Army Melia Primary Care Donetta Isaza: [name] Other Clinician: Referring Miamarie Moll: [name] Treating Aviendha Azbell/Extender: Beather Arbour Weeks in Treatment: 0 Electronic Signature(s) Signed: 08/28/2018 2:34:20 PM By: Army Melia Entered By: Army Melia on 08/28/2018 13:23:11 [name], [name] ([id number]) -------------------------------------------------------------------------------- Arrival Information Details Patient Name: Jasmine Hays Date of Service: 08/28/2018 1:00 PM Medical Record Number: [id number] Patient Account Number: [id number] Date of Birth/Sex: 11-Mar-1930 (85 y.o. F) Treating RN: Army Melia Primary Care Lendy Dittrich: [name] Other Clinician: Referring Aragorn Recker: [name] Treating Etoile Looman/Extender: Oneida Arenas in Treatment: 0 Visit Information Patient Arrived: Ambulatory Arrival Time: 13:10 Accompanied By: daughter Transfer Assistance: None Patient Identification Verified: Yes Electronic Signature(s) Signed: 08/28/2018 2:34:20 PM By: Army Melia Entered By: Army Melia on 08/28/2018 13:10:18 [name], [name] ([id number]) -------------------------------------------------------------------------------- Clinic Level of Care Assessment Details Patient Name: Jasmine Hays Date of Service: 08/28/2018 1:00 PM Medical Record Number: [id number] Patient Account Number: [id number] Date of Birth/Sex: [birth year] (85 y.o. F) Treating RN: Harold Barban Primary Care Montey Ebel: [name] Other Clinician: Referring Ledon Weihe: [name] Treating Sherwin Hollingshed/Extender: Oneida Arenas in Treatment: 0 Clinic Level of Care Assessment Items TOOL 4 Quantity Score []  - Use when only an EandM  is performed on FOLLOW-UP visit 0 ASSESSMENTS - Nursing Assessment / Reassessment X - Reassessment of Co-morbidities (includes updates in patient status) 1 10 X- 1 5 Reassessment of Adherence to Treatment Plan ASSESSMENTS - Wound and Skin Assessment / Reassessment X - Simple Wound Assessment / Reassessment - one wound 1 5 []  - 0 Complex Wound Assessment / Reassessment - multiple wounds []  - 0 Dermatologic / Skin Assessment (not related to wound area) ASSESSMENTS - Focused Assessment []  - Circumferential Edema Measurements - multi extremities 0 []  - 0 Nutritional Assessment / Counseling / Intervention []  - 0 Lower Extremity Assessment (monofilament, tuning fork, pulses) []  - 0 Peripheral Arterial Disease Assessment (using hand held doppler) ASSESSMENTS - Ostomy and/or Continence Assessment and Care []  - Incontinence Assessment and Management 0 []  - 0 Ostomy Care Assessment and Management (repouching, etc.) PROCESS - Coordination of Care X - Simple Patient / Family Education for ongoing care 1 15 []  - 0 Complex (extensive) Patient / Family Education for ongoing care []  - 0 Staff obtains Programmer, systems, Records, Test Results / Process Orders []  - 0 Staff telephones HHA, Nursing Homes / Clarify orders / etc []  - 0 Routine Transfer to another Facility (non-emergent condition) []  - 0 Routine Hospital Admission (non-emergent condition) X- 1 15 New Admissions / Biomedical engineer / Ordering NPWT, Apligraf, etc. []  - 0 Emergency Hospital Admission (emergent condition) X- 1 10 Simple Discharge Coordination [name], [name] ([id number]) []  - 0 Complex (extensive) Discharge Coordination PROCESS - Special Needs []  - Pediatric / Minor Patient Management 0 []  - 0 Isolation Patient Management []  - 0 Hearing / Language / Visual special needs []  - 0 Assessment of Community assistance (transportation, D/C planning, etc.) []  - 0 Additional assistance / Altered mentation []  -  0 Support Surface(s) Assessment (bed, cushion, seat, etc.) INTERVENTIONS - Wound Cleansing / Measurement X - Simple Wound Cleansing - one wound 1 5 []  - 0 Complex Wound Cleansing - multiple wounds X- 1 5 Wound Imaging (photographs - any number of wounds) []  - 0 Wound Tracing (instead of  photographs) X- 1 5 Simple Wound Measurement - one wound []  - 0 Complex Wound Measurement - multiple wounds INTERVENTIONS - Wound Dressings X - Small Wound Dressing one or multiple wounds 1 10 []  - 0 Medium Wound Dressing one or multiple wounds []  - 0 Large Wound Dressing one or multiple wounds []  - 0 Application of Medications - topical []  - 0 Application of Medications - injection INTERVENTIONS - Miscellaneous []  - External ear exam 0 []  - 0 Specimen Collection (cultures, biopsies, blood, body fluids, etc.) []  - 0 Specimen(s) / Culture(s) sent or taken to Lab for analysis []  - 0 Patient Transfer (multiple staff / Civil Service fast streamer / Similar devices) []  - 0 Simple Staple / Suture removal (25 or less) []  - 0 Complex Staple / Suture removal (26 or more) []  - 0 Hypo / Hyperglycemic Management (close monitor of Blood Glucose) []  - 0 Ankle / Brachial Index (ABI) - do not check if billed separately X- 1 5 Vital Signs [name], [name] ([id number]) Has the patient been seen at the hospital within the last three years: Yes Total Score: 90 Level Of Care: New/Established - Level 3 Electronic Signature(s) Signed: 08/28/2018 2:48:33 PM By: Harold Barban Entered By: Harold Barban on 08/28/2018 13:56:37 Pyper, [name] ([id number]) -------------------------------------------------------------------------------- Encounter Discharge Information Details Patient Name: Jasmine Hays Date of Service: 08/28/2018 1:00 PM Medical Record Number: [id number] Patient Account Number: [id number] Date of Birth/Sex: [birth year] (85 y.o. F) Treating RN: Cornell Barman Primary Care Carolynn Tuley: [name] Other  Clinician: Referring Tyannah Sane: [name] Treating Franklyn Cafaro/Extender: Oneida Arenas in Treatment: 0 Encounter Discharge Information Items Discharge Condition: Stable Ambulatory Status: Ambulatory Discharge Destination: Home Transportation: Private Auto Accompanied By: daughter Schedule Follow-up Appointment: Yes Clinical Summary of Care: Electronic Signature(s) Signed: 08/28/2018 4:00:49 PM By: Gretta Cool, BSN, RN, CWS, Kim RN, BSN Entered By: Gretta Cool, BSN, RN, CWS, Kim on 08/28/2018 14:01:32 Laning, [name] ([id number]) -------------------------------------------------------------------------------- Lower Extremity Assessment Details Patient Name: Jasmine Hays Date of Service: 08/28/2018 1:00 PM Medical Record Number: [id number] Patient Account Number: [id number] Date of Birth/Sex: 03/07/1930 (85 y.o. F) Treating RN: Army Melia Primary Care Caesar Mannella: [name] Other Clinician: Referring Elijah Phommachanh: [name] Treating Ryken Paschal/Extender: Beather Arbour Weeks in Treatment: 0 Edema Assessment Assessed: [Left: No] [Right: No] [Left: Edema] [Right: :] Calf Left: Right: Point of Measurement: 32 cm From Medial Instep cm 37 cm Ankle Left: Right: Point of Measurement: 11 cm From Medial Instep cm 22 cm Vascular Assessment Pulses: Dorsalis Pedis Palpable: [Right:Yes] Posterior Tibial Extremity colors, hair growth, and conditions: Extremity Color: [Right:Red] Hair Growth on Extremity: [Right:Yes] Temperature of Extremity: [Right:Warm] Capillary Refill: [Right:< 3 seconds] Toe Nail Assessment Left: Right: Thick: No Discolored: No Deformed: No Improper Length and Hygiene: No Electronic Signature(s) Signed: 08/28/2018 2:34:20 PM By: Army Melia Entered By: Army Melia on 08/28/2018 13:22:44 Briere, [name] ([id number]) -------------------------------------------------------------------------------- Multi Wound Chart Details Patient Name: Jasmine Hays Date of  Service: 08/28/2018 1:00 PM Medical Record Number: [id number] Patient Account Number: [id number] Date of Birth/Sex: May 15, 1930 (85 y.o. F) Treating RN: Harold Barban Primary Care Elani Delph: [name] Other Clinician: Referring Chaundra Abreu: [name] Treating Lipa Knauff/Extender: Oneida Arenas in Treatment: 0 Vital Signs Height(in): 66 Pulse(bpm): 102 Weight(lbs): 200 Blood Pressure(mmHg): 181/86 Body Mass Index(BMI): 32 Temperature(F): 97.9 Respiratory Rate 16 (breaths/min): Wound Assessments Treatment Notes Electronic Signature(s) Signed: 08/28/2018 2:48:33 PM By: Harold Barban Entered By: Harold Barban on 08/28/2018 13:51:10 Wilbon, Anastacia C. ([id number]) -------------------------------------------------------------------------------- Pain Assessment Details Patient Name: Leinen, Saniya C. Date of  Service: 08/28/2018 1:00 PM Medical Record Number: [id number] Patient Account Number: [id number] Date of Birth/Sex: 1929-07-16 (85 y.o. F) Treating RN: Army Melia Primary Care Kortne All: [name] Other Clinician: Referring Terilyn Sano: [name] Treating Lakota Markgraf/Extender: Oneida Arenas in Treatment: 0 Active Problems Location of Pain Severity and Description of Pain Patient Has Paino Yes Site Locations Pain Location: Generalized Pain Rate the pain. Current Pain Level: 3 Pain Management and Medication Current Pain Management: Electronic Signature(s) Signed: 08/28/2018 2:34:20 PM By: Army Melia Entered By: Army Melia on 08/28/2018 13:19:48 Venson, [name] ([id number]) -------------------------------------------------------------------------------- Patient/Caregiver Education Details Patient Name: Jasmine Hays Date of Service: 08/28/2018 1:00 PM Medical Record Number: [id number] Patient Account Number: [id number] Date of Birth/Gender: Sep 24, 1929 (85 y.o. F) Treating RN: Harold Barban Primary Care Physician: [name] Other Clinician: Referring  Physician: [name] Treating Physician/Extender: Oneida Arenas in Treatment: 0 Education Assessment Education Provided To: Patient Education Topics Provided Wound/Skin Impairment: Handouts: Caring for Your Ulcer Methods: Demonstration, Explain/Verbal Responses: State content correctly Electronic Signature(s) Signed: 08/28/2018 4:00:49 PM By: Gretta Cool, BSN, RN, CWS, Kim RN, BSN Entered By: Gretta Cool, BSN, RN, CWS, Kim on 08/28/2018 14:01:40 Cesaro, [name] ([id number]) -------------------------------------------------------------------------------- Vitals Details Patient Name: Jasmine Hays Date of Service: 08/28/2018 1:00 PM Medical Record Number: [id number] Patient Account Number: [id number] Date of Birth/Sex: 03-25-1930 (85 y.o. F) Treating RN: Army Melia Primary Care Liem Copenhaver: [name] Other Clinician: Referring Laniece Hornbaker: [name] Treating Yicel Shannon/Extender: Oneida Arenas in Treatment: 0 Vital Signs Time Taken: 13:15 Temperature (F): 97.9 Height (in): 66 Pulse (bpm): 102 Source: Stated Respiratory Rate (breaths/min): 16 Weight (lbs): 200 Blood Pressure (mmHg): 181/86 Source: Stated Reference Range: 80 - 120 mg / dl Body Mass Index (BMI): 32.3 Electronic Signature(s) Signed: 08/28/2018 2:34:20 PM By: Army Melia Entered By: Army Melia on 08/28/2018 13:20:51

## 2018-10-12 ENCOUNTER — Telehealth (INDEPENDENT_AMBULATORY_CARE_PROVIDER_SITE_OTHER): Payer: Self-pay | Admitting: Vascular Surgery

## 2018-10-12 NOTE — Telephone Encounter (Signed)
I spoke with patient daughter and gave her Arna Medici NP medical advice and stated that she has tried all the recommended options but she will continue using the cream until upcoming appointment

## 2018-10-12 NOTE — Telephone Encounter (Signed)
Jasmine Hays is a new patient to our practice, therefore we have never had the chance to evaluate.  She can also try OTC benadryl and ice to the area that itching in addition to the anti itch cream that she is using.  Once we evaluate her on Monday, we can see what other treatment options there are.

## 2018-10-16 ENCOUNTER — Encounter (INDEPENDENT_AMBULATORY_CARE_PROVIDER_SITE_OTHER): Payer: Medicare Other | Admitting: Vascular Surgery

## 2018-10-19 ENCOUNTER — Encounter (INDEPENDENT_AMBULATORY_CARE_PROVIDER_SITE_OTHER): Payer: Medicare Other | Admitting: Vascular Surgery

## 2018-10-26 ENCOUNTER — Other Ambulatory Visit: Payer: Self-pay

## 2018-10-26 ENCOUNTER — Encounter (INDEPENDENT_AMBULATORY_CARE_PROVIDER_SITE_OTHER): Payer: Self-pay | Admitting: Vascular Surgery

## 2018-10-26 ENCOUNTER — Ambulatory Visit (INDEPENDENT_AMBULATORY_CARE_PROVIDER_SITE_OTHER): Payer: Medicare Other | Admitting: Vascular Surgery

## 2018-10-26 DIAGNOSIS — I83013 Varicose veins of right lower extremity with ulcer of ankle: Secondary | ICD-10-CM | POA: Diagnosis not present

## 2018-10-26 DIAGNOSIS — I890 Lymphedema, not elsewhere classified: Secondary | ICD-10-CM | POA: Diagnosis not present

## 2018-10-26 DIAGNOSIS — L97319 Non-pressure chronic ulcer of right ankle with unspecified severity: Secondary | ICD-10-CM | POA: Diagnosis not present

## 2018-10-26 DIAGNOSIS — I872 Venous insufficiency (chronic) (peripheral): Secondary | ICD-10-CM | POA: Insufficient documentation

## 2018-10-26 DIAGNOSIS — F17210 Nicotine dependence, cigarettes, uncomplicated: Secondary | ICD-10-CM

## 2018-10-26 DIAGNOSIS — I83019 Varicose veins of right lower extremity with ulcer of unspecified site: Secondary | ICD-10-CM | POA: Insufficient documentation

## 2018-10-26 DIAGNOSIS — L97919 Non-pressure chronic ulcer of unspecified part of right lower leg with unspecified severity: Principal | ICD-10-CM | POA: Insufficient documentation

## 2018-10-26 DIAGNOSIS — M79606 Pain in leg, unspecified: Secondary | ICD-10-CM | POA: Insufficient documentation

## 2018-10-26 NOTE — Progress Notes (Signed)
MRN : [id number]  Jasmine Hays is a 85 y.o. ([birth year]) female who presents with chief complaint of  Chief Complaint  Patient presents with  . New Patient (Initial Visit)  .  History of Present Illness:   Patient is seen for evaluation of leg pain and swelling associated with new onset ulceration of the right lower extremity. The patient first noticed the swelling remotely. The swelling is associated with pain and discoloration. The pain and swelling worsens with prolonged dependency and improves with elevation. The pain is unrelated to activity.  The patient notes that in the morning the legs are better but the leg symptoms worsened throughout the course of the day. The patient has also noted a progressive worsening of the discoloration in the ankle and shin area.   The patient notes that an ulcer has developed acutely without specific trauma and since it occurred it has been very slow to heal.  There is a moderate amount of drainage associated with the open area.  The wound is also very painful.  The patient denies claudication symptoms or rest pain symptoms.  The patient has DJD and LS spine disease.  The patient has not had any past angiography, interventions or vascular surgery.  Elevation makes the leg symptoms better, dependency makes them much worse. The patient denies any recent changes in medications.  The patient has not been wearing graduated compression.  The patient denies a history of DVT or PE. There is no prior history of phlebitis. There is no history of primary lymphedema.  No history of malignancies. No history of trauma or groin or pelvic surgery. There is no history of radiation treatment to the groin or pelvis       No outpatient medications have been marked as taking for the 10/26/18 encounter (Office Visit) with Delana Meyer, Dolores Lory, MD.    Past Medical History:  Diagnosis Date  . Breast cancer [facility])     Past Surgical History:  Procedure Laterality  Date  . ABDOMINAL HYSTERECTOMY    . BREAST SURGERY     lumpectomy    Social History Social History   Tobacco Use  . Smoking status: Current Every Day Smoker    Packs/day: 1.50    Types: Cigarettes  . Smokeless tobacco: Never Used  Substance Use Topics  . Alcohol use: No  . Drug use: No    Family History No family history on file. No family history of bleeding/clotting disorders, porphyria or autoimmune disease   No Known Allergies   REVIEW OF SYSTEMS (Negative unless checked)  Constitutional: [] Weight loss  [] Fever  [] Chills Cardiac: [] Chest pain   [] Chest pressure   [] Palpitations   [] Shortness of breath when laying flat   [] Shortness of breath with exertion. Vascular:  [] Pain in legs with walking   [] Pain in legs at rest  [] History of DVT   [] Phlebitis   [x] Swelling in legs   [x] Varicose veins   [] Non-healing ulcers Pulmonary:   [] Uses home oxygen   [] Productive cough   [] Hemoptysis   [] Wheeze  [] COPD   [] Asthma Neurologic:  [] Dizziness   [] Seizures   [] History of stroke   [] History of TIA  [] Aphasia   [] Vissual changes   [] Weakness or numbness in arm   [] Weakness or numbness in leg Musculoskeletal:   [] Joint swelling   [x] Joint pain   [x] Low back pain Hematologic:  [] Easy bruising  [] Easy bleeding   [] Hypercoagulable state   [] Anemic Gastrointestinal:  [] Diarrhea   [] Vomiting  [] Gastroesophageal  reflux/heartburn   [] Difficulty swallowing. Genitourinary:  [] Chronic kidney disease   [] Difficult urination  [] Frequent urination   [] Blood in urine Skin:  [x] Rashes   [] Ulcers  Psychological:  [] History of anxiety   []  History of major depression.  Physical Examination  Vitals:   10/26/18 1500  BP: 140/83  Pulse: 92  Resp: 16  Weight: 217 lb (98.4 kg)  Height: 5\' 6"  (1.676 m)   Body mass index is 35.02 kg/m. Gen: WD/WN, NAD Head: [place]/AT, No temporalis wasting.  Ear/Nose/Throat: Hearing grossly intact, nares w/o erythema or drainage, poor dentition Eyes: PER, EOMI,  sclera nonicteric.  Neck: Supple, no masses.  No bruit or JVD.  Pulmonary:  Good air movement, clear to auscultation bilaterally, no use of accessory muscles.  Cardiac: RRR, normal S1, S2, no Murmurs. Vascular: 2-3+ edema of the right leg with severe venous changes of the right leg.  Venous ulcer noted in the ankle area on the right, noninfected Vessel Right Left  PT Palpable Palpable  DP Palpable Palpable  Gastrointestinal: soft, non-distended. No guarding/no peritoneal signs.  Musculoskeletal: M/S 5/5 throughout.  No deformity or atrophy.  Neurologic: CN 2-12 intact. Pain and light touch intact in extremities.  Symmetrical.  Speech is fluent. Motor exam as listed above. Psychiatric: Judgment intact, Mood & affect appropriate for pt's clinical situation. Dermatologic: Venous stasis dermatitis with ulcers present on the right.  No changes consistent with cellulitis. Lymph : No Cervical lymphadenopathy, no lichenification or skin changes of chronic lymphedema.  CBC Lab Results  Component Value Date   WBC 8.5 11/23/2015   HGB 15.7 11/23/2015   HCT 47.5 (H) 11/23/2015   MCV 89.8 11/23/2015   PLT 242 11/23/2015    BMET    Component Value Date/Time   NA 140 11/23/2015 0806   NA 144 02/13/2014 0534   K 3.6 11/23/2015 0806   K 4.2 02/13/2014 0534   CL 102 11/23/2015 0806   CL 113 (H) 02/13/2014 0534   CO2 30 11/23/2015 0806   CO2 22 02/13/2014 0534   GLUCOSE 114 (H) 11/23/2015 0806   GLUCOSE 88 02/13/2014 0534   BUN 19 11/23/2015 0806   BUN 12 02/13/2014 0534   CREATININE 1.39 (H) 11/23/2015 0806   CREATININE 1.15 02/13/2014 0534   CALCIUM 10.0 11/23/2015 0806   CALCIUM 8.3 (L) 02/13/2014 0534   GFRNONAA 34 (L) 11/23/2015 0806   GFRNONAA 44 (L) 02/13/2014 0534   GFRAA 39 (L) 11/23/2015 0806   GFRAA 51 (L) 02/13/2014 0534   CrCl cannot be calculated (Patient's most recent lab result is older than the maximum 21 days allowed.).  COAG No results found for: INR, PROTIME   Radiology No results found.   Assessment/Plan 1. Venous ulcer of right leg ([place]) Patient is seen for evaluation of leg pain and swelling associated with new onset ulceration. The patient first noticed the swelling remotely. The swelling is associated with pain and discoloration. The pain and swelling worsens with prolonged dependency and improves with elevation. The pain is unrelated to activity.  The patient notes that in the morning the legs are better but the leg symptoms worsened throughout the course of the day. The patient has also noted a progressive worsening of the discoloration in the ankle and shin area.   The patient notes that an ulcer has developed acutely without specific trauma and since it occurred it has been very slow to heal.  There is a moderate amount of drainage associated with the open area.  The  wound is also very painful.  The patient denies claudication symptoms or rest pain symptoms.  The patient denies DJD and LS spine disease.  The patient has not had any past angiography, interventions or vascular surgery.  Elevation makes the leg symptoms better, dependency makes them much worse. The patient denies any recent changes in medications.  The patient has not been wearing graduated compression.  The patient denies a history of DVT or PE. There is no prior history of phlebitis. There is no history of primary lymphedema.  No history of malignancies. No history of trauma or groin or pelvic surgery. There is no history of radiation treatment to the groin or pelvis    2. Chronic venous insufficiency No surgery or intervention at this point in time.    I have had a long discussion with the patient regarding venous insufficiency and why it  causes symptoms. I have discussed with the patient the chronic skin changes that accompany venous insufficiency and the long term sequela such as infection and ulceration.  Patient will begin wearing graduated compression stockings  class 1 (20-30 mmHg) or compression wraps on a daily basis a prescription was given. The patient will put the stockings on first thing in the morning and removing them in the evening. The patient is instructed specifically not to sleep in the stockings.    In addition, behavioral modification including several periods of elevation of the lower extremities during the day will be continued. I have demonstrated that proper elevation is a position with the ankles at heart level.  The patient is instructed to begin routine exercise, especially walking on a daily basis  The patient will follow up in one week to reassess the degree of swelling and the control that graduated compression stockings or compression wraps  is offering.   The patient can be assessed for a Lymph Pump at that time   3. Lymphedema No surgery or intervention at this point in time.    I have had a long discussion with the patient regarding venous insufficiency and why it  causes symptoms. I have discussed with the patient the chronic skin changes that accompany venous insufficiency and the long term sequela such as infection and ulceration.  Patient will begin wearing graduated compression stockings class 1 (20-30 mmHg) or compression wraps on a daily basis a prescription was given. The patient will put the stockings on first thing in the morning and removing them in the evening. The patient is instructed specifically not to sleep in the stockings.    In addition, behavioral modification including several periods of elevation of the lower extremities during the day will be continued. I have demonstrated that proper elevation is a position with the ankles at heart level.  The patient is instructed to begin routine exercise, especially walking on a daily basis  The patient will follow up in one week to reassess the degree of swelling and the control that graduated compression stockings or compression wraps  is offering.   The patient  can be assessed for a Lymph Pump at that time    Jasmine Pilar, MD  10/26/2018 3:03 PM

## 2018-11-02 ENCOUNTER — Ambulatory Visit (INDEPENDENT_AMBULATORY_CARE_PROVIDER_SITE_OTHER): Payer: Medicare Other | Admitting: Nurse Practitioner

## 2019-01-16 DIAGNOSIS — L309 Dermatitis, unspecified: Secondary | ICD-10-CM | POA: Diagnosis not present

## 2019-01-16 DIAGNOSIS — M545 Low back pain: Secondary | ICD-10-CM | POA: Diagnosis not present

## 2019-01-16 DIAGNOSIS — L853 Xerosis cutis: Secondary | ICD-10-CM | POA: Diagnosis not present

## 2019-01-16 DIAGNOSIS — J449 Chronic obstructive pulmonary disease, unspecified: Secondary | ICD-10-CM | POA: Diagnosis not present

## 2019-01-17 DIAGNOSIS — R5381 Other malaise: Secondary | ICD-10-CM | POA: Diagnosis not present

## 2019-01-17 DIAGNOSIS — I10 Essential (primary) hypertension: Secondary | ICD-10-CM | POA: Diagnosis not present

## 2019-01-18 ENCOUNTER — Ambulatory Visit (INDEPENDENT_AMBULATORY_CARE_PROVIDER_SITE_OTHER): Payer: Medicare Other | Admitting: Vascular Surgery

## 2019-06-01 ENCOUNTER — Telehealth (INDEPENDENT_AMBULATORY_CARE_PROVIDER_SITE_OTHER): Payer: Self-pay | Admitting: Vascular Surgery

## 2019-06-01 NOTE — Telephone Encounter (Signed)
I spoke with patient daughter and she stated that the right leg is red,itching,and painful with small ulcers for over a month. The patient has seen Dr Lavera Guise office and was prescribe a steroid cream that she has been taking 6weeks which helps with itching. The patient daughter stated that her mom has dementia.

## 2019-06-01 NOTE — Telephone Encounter (Signed)
She likely needs unna wraps.  We can get her in with no studies next week sometime

## 2019-06-05 ENCOUNTER — Encounter (INDEPENDENT_AMBULATORY_CARE_PROVIDER_SITE_OTHER): Payer: Medicare Other

## 2019-06-06 ENCOUNTER — Other Ambulatory Visit: Payer: Self-pay

## 2019-06-06 ENCOUNTER — Ambulatory Visit (INDEPENDENT_AMBULATORY_CARE_PROVIDER_SITE_OTHER): Payer: Medicare Other | Admitting: Nurse Practitioner

## 2019-06-06 VITALS — BP 163/106 | HR 93 | Resp 18 | Ht 66.0 in | Wt 193.0 lb

## 2019-06-06 DIAGNOSIS — I83019 Varicose veins of right lower extremity with ulcer of unspecified site: Secondary | ICD-10-CM | POA: Diagnosis not present

## 2019-06-06 DIAGNOSIS — L97919 Non-pressure chronic ulcer of unspecified part of right lower leg with unspecified severity: Secondary | ICD-10-CM

## 2019-06-06 NOTE — Progress Notes (Signed)
History of Present Illness  There is no documented history at this time  Assessments & Plan   There are no diagnoses linked to this encounter.    Additional instructions  Subjective:  Patient presents with venous ulcer of the Right lower extremity.    Procedure:  3 layer unna wrap was placed Right lower extremity.   Plan:   Follow up in one week.   

## 2019-06-10 ENCOUNTER — Encounter (INDEPENDENT_AMBULATORY_CARE_PROVIDER_SITE_OTHER): Payer: Self-pay | Admitting: Nurse Practitioner

## 2019-06-11 ENCOUNTER — Encounter (INDEPENDENT_AMBULATORY_CARE_PROVIDER_SITE_OTHER): Payer: Medicare Other

## 2019-06-12 ENCOUNTER — Encounter (INDEPENDENT_AMBULATORY_CARE_PROVIDER_SITE_OTHER): Payer: Medicare Other

## 2019-06-18 ENCOUNTER — Encounter (INDEPENDENT_AMBULATORY_CARE_PROVIDER_SITE_OTHER): Payer: Medicare Other

## 2019-06-25 ENCOUNTER — Ambulatory Visit (INDEPENDENT_AMBULATORY_CARE_PROVIDER_SITE_OTHER): Payer: Medicare Other | Admitting: Nurse Practitioner

## 2020-01-09 ENCOUNTER — Ambulatory Visit: Payer: Medicare Other | Admitting: Internal Medicine

## 2020-01-11 ENCOUNTER — Ambulatory Visit: Payer: Medicare Other | Admitting: Internal Medicine

## 2020-01-14 ENCOUNTER — Other Ambulatory Visit: Payer: Self-pay

## 2020-01-14 ENCOUNTER — Ambulatory Visit (INDEPENDENT_AMBULATORY_CARE_PROVIDER_SITE_OTHER): Payer: Medicare Other | Admitting: Internal Medicine

## 2020-01-14 ENCOUNTER — Encounter: Payer: Self-pay | Admitting: Internal Medicine

## 2020-01-14 VITALS — BP 163/114 | HR 97 | Ht 68.0 in | Wt 182.3 lb

## 2020-01-14 DIAGNOSIS — H9192 Unspecified hearing loss, left ear: Secondary | ICD-10-CM | POA: Insufficient documentation

## 2020-01-14 DIAGNOSIS — I872 Venous insufficiency (chronic) (peripheral): Secondary | ICD-10-CM | POA: Diagnosis not present

## 2020-01-14 DIAGNOSIS — F17200 Nicotine dependence, unspecified, uncomplicated: Secondary | ICD-10-CM | POA: Diagnosis not present

## 2020-01-14 DIAGNOSIS — R413 Other amnesia: Secondary | ICD-10-CM | POA: Diagnosis not present

## 2020-01-14 DIAGNOSIS — G8929 Other chronic pain: Secondary | ICD-10-CM

## 2020-01-14 DIAGNOSIS — M5442 Lumbago with sciatica, left side: Secondary | ICD-10-CM | POA: Diagnosis not present

## 2020-01-14 MED ORDER — ALBUTEROL SULFATE (2.5 MG/3ML) 0.083% IN NEBU
2.5000 mg | INHALATION_SOLUTION | Freq: Four times a day (QID) | RESPIRATORY_TRACT | 6 refills | Status: DC | PRN
Start: 2020-01-14 — End: 2020-10-20

## 2020-01-14 MED ORDER — PAROXETINE HCL 20 MG PO TABS: 20.0000 mg | ORAL_TABLET | Freq: Every day | ORAL | 6 refills | Status: DC

## 2020-01-14 MED ORDER — HYDROCODONE-ACETAMINOPHEN 5-325 MG PO TABS: 0.5000 | ORAL_TABLET | Freq: Four times a day (QID) | ORAL | 0 refills | Status: DC | PRN

## 2020-01-14 MED ORDER — NEBULIZER/TUBING/MOUTHPIECE KIT: PACK | 3 refills | Status: DC

## 2020-01-14 NOTE — Assessment & Plan Note (Signed)
Patient has a chronic lower back pain with pain going down to the left more than the right.  Pain medication was refilled.  Seen the pain specialist in the past

## 2020-01-14 NOTE — Assessment & Plan Note (Signed)
Was advised to wear TED stockings
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
## 2020-01-14 NOTE — Assessment & Plan Note (Signed)
I doubt she is going to stop  smoking

## 2020-01-14 NOTE — Assessment & Plan Note (Signed)
Both ears of the patient was checked was found to have a small amount of wax in the right ear left ear is going to relax.  I feel that she has a hearing loss due to more otosclerosis.  Refer to ENT.

## 2020-01-14 NOTE — Assessment & Plan Note (Signed)
Patient will be referred to neurologist.  Today she was found to be wheezing so she was started on albuterol treatment.  She was strongly advised to stop smoking completely.  She was asking for Klonopin and that request was denied.

## 2020-01-22 ENCOUNTER — Other Ambulatory Visit: Payer: Self-pay

## 2020-01-22 NOTE — Telephone Encounter (Signed)
Patient's daughter called states her mother was seen on July 15, with back pain. Hydrocodone was given at the visit. Ms. Mel Almond (patient's daughter/caregiver) said she has not picked up the rx for the pain medicine and would like  Ms. Brogdon's Klonopin called in instead to help her rest at night. She feels this would be more beneficial for her mom and states she will continue using the pain relief cream and tylenol for the back pain.

## 2020-01-24 MED ORDER — CLONAZEPAM 0.5 MG PO TABS: 0.5000 mg | ORAL_TABLET | Freq: Every day | ORAL | 0 refills | Status: DC

## 2020-01-28 ENCOUNTER — Ambulatory Visit: Payer: Medicare Other | Admitting: Internal Medicine

## 2020-02-29 ENCOUNTER — Other Ambulatory Visit: Payer: Self-pay | Admitting: Internal Medicine

## 2020-04-03 ENCOUNTER — Encounter: Payer: Self-pay | Admitting: Family Medicine

## 2020-04-03 ENCOUNTER — Ambulatory Visit (INDEPENDENT_AMBULATORY_CARE_PROVIDER_SITE_OTHER): Payer: Medicare Other | Admitting: Family Medicine

## 2020-04-03 ENCOUNTER — Other Ambulatory Visit: Payer: Self-pay

## 2020-04-03 VITALS — BP 159/97 | HR 72 | Ht 65.0 in | Wt 208.0 lb

## 2020-04-03 DIAGNOSIS — Z23 Encounter for immunization: Secondary | ICD-10-CM | POA: Diagnosis not present

## 2020-04-03 DIAGNOSIS — L309 Dermatitis, unspecified: Secondary | ICD-10-CM | POA: Diagnosis not present

## 2020-04-03 DIAGNOSIS — G8929 Other chronic pain: Secondary | ICD-10-CM

## 2020-04-03 DIAGNOSIS — M5442 Lumbago with sciatica, left side: Secondary | ICD-10-CM | POA: Diagnosis not present

## 2020-04-03 DIAGNOSIS — R413 Other amnesia: Secondary | ICD-10-CM

## 2020-04-03 DIAGNOSIS — I872 Venous insufficiency (chronic) (peripheral): Secondary | ICD-10-CM

## 2020-04-03 MED ORDER — CLONAZEPAM 0.5 MG PO TABS: 0.5000 mg | ORAL_TABLET | Freq: Every day | ORAL | 0 refills | Status: DC

## 2020-04-03 MED ORDER — CLOBETASOL PROPIONATE 0.05 % EX CREA: TOPICAL_CREAM | Freq: Two times a day (BID) | CUTANEOUS | Status: AC

## 2020-04-03 MED ORDER — HYDROCODONE-ACETAMINOPHEN 5-325 MG PO TABS: 0.5000 | ORAL_TABLET | Freq: Four times a day (QID) | ORAL | 0 refills | Status: DC | PRN

## 2020-04-03 NOTE — Patient Instructions (Signed)
Please remember not to take klonopin and

## 2020-04-03 NOTE — Progress Notes (Signed)
Established Patient Office Visit  SUBJECTIVE:  Subjective  Patient ID: Jasmine Hays, female    DOB: 07-Sep-1929  Age: 86 y.o. MRN: [id number]  CC:  Chief Complaint  Patient presents with  . Back Pain    patient has chroic back pain that is worse at times, but is having a flare up right now     HPI Jasmine Hays is a 86 y.o. female presenting today for an evaluation of back pain.   Back Pain This is a chronic problem. The current episode started more than 1 year ago. The problem occurs intermittently. The problem has been gradually worsening since onset. The pain is present in the lumbar spine. Pertinent negatives include Hays chest pain or numbness.   She used to see pain management clinic, but she has not been in several years.   She continues to have difficulty sleeping due to stress of family issues. She takes klonopin to treat with relief. She also has an issue with eczema on the legs that causes her a lot of itching. She has a cream for this that helps, but she is out and needs a refill.   She would like a flu vaccine today.    Past Medical History:  Diagnosis Date  . Breast cancer [facility])     Past Surgical History:  Procedure Laterality Date  . ABDOMINAL HYSTERECTOMY    . BREAST SURGERY     lumpectomy    Hays family history on file.  Social History   Socioeconomic History  . Marital status: Widowed    Spouse name: Not on file  . Number of children: Not on file  . Years of education: Not on file  . Highest education level: Not on file  Occupational History  . Not on file  Tobacco Use  . Smoking status: Current Every Day Smoker    Packs/day: 1.50    Types: Cigarettes  . Smokeless tobacco: Never Used  Substance and Sexual Activity  . Alcohol use: Hays  . Drug use: Hays  . Sexual activity: Not on file  Other Topics Concern  . Not on file  Social History Narrative  . Not on file   Social Determinants of Health   Financial Resource Strain:   . Difficulty of  Paying Living Expenses: Not on file  Food Insecurity:   . Worried About Charity fundraiser in the Last Year: Not on file  . Ran Out of Food in the Last Year: Not on file  Transportation Needs:   . Lack of Transportation (Medical): Not on file  . Lack of Transportation (Non-Medical): Not on file  Physical Activity:   . Days of Exercise per Week: Not on file  . Minutes of Exercise per Session: Not on file  Stress:   . Feeling of Stress : Not on file  Social Connections:   . Frequency of Communication with Friends and Family: Not on file  . Frequency of Social Gatherings with Friends and Family: Not on file  . Attends Religious Services: Not on file  . Active Member of Clubs or Organizations: Not on file  . Attends Archivist Meetings: Not on file  . Marital Status: Not on file  Intimate Partner Violence:   . Fear of Current or Ex-Partner: Not on file  . Emotionally Abused: Not on file  . Physically Abused: Not on file  . Sexually Abused: Not on file     Current Outpatient Medications:  .  albuterol (  PROVENTIL) (2.5 MG/3ML) 0.083% nebulizer solution, Take 3 mLs (2.5 mg total) by nebulization every 6 (six) hours as needed for wheezing or shortness of breath., Disp: 150 mL, Rfl: 6 .  clonazePAM (KLONOPIN) 0.5 MG tablet, Take 1 tablet (0.5 mg total) by mouth daily., Disp: 30 tablet, Rfl: 0 .  furosemide (LASIX) 20 MG tablet, Take 20 mg by mouth daily., Disp: , Rfl:  .  PARoxetine (PAXIL) 20 MG tablet, Take 1 tablet (20 mg total) by mouth daily., Disp: 30 tablet, Rfl: 6 .  pravastatin (PRAVACHOL) 20 MG tablet, Take 20 mg by mouth daily., Disp: , Rfl:  .  Respiratory Therapy Supplies (NEBULIZER/TUBING/MOUTHPIECE) KIT, Use with nebulizer treatment, Disp: 6 kit, Rfl: 3 .  rOPINIRole (REQUIP) 2 MG tablet, Take 2 mg by mouth 2 (two) times a day., Disp: , Rfl:  .  umeclidinium-vilanterol (ANORO ELLIPTA) 62.5-25 MCG/INH AEPB, Inhale 1 puff into the lungs daily., Disp: , Rfl:  .   HYDROcodone-acetaminophen (NORCO/VICODIN) 5-325 MG tablet, Take 0.5 tablets by mouth every 6 (six) hours as needed for moderate pain., Disp: 25 tablet, Rfl: 0  Current Facility-Administered Medications:  .  clobetasol cream (TEMOVATE) 0.05 %, , Topical, BID, Beckie Salts, FNP   Hays Known Allergies  ROS Review of Systems  Constitutional: Negative.   HENT: Negative.   Eyes: Negative.   Respiratory: Negative.   Cardiovascular: Negative.  Negative for chest pain.  Gastrointestinal: Negative.   Endocrine: Negative.   Genitourinary: Negative.   Musculoskeletal: Positive for back pain.  Skin: Negative.        Itching, eczema  Allergic/Immunologic: Negative.   Neurological: Negative.  Negative for numbness.  Hematological: Negative.   Psychiatric/Behavioral: Positive for sleep disturbance.  All other systems reviewed and are negative.    OBJECTIVE:    Physical Exam Vitals reviewed.  Constitutional:      Appearance: Normal appearance.  HENT:     Mouth/Throat:     Mouth: Mucous membranes are moist.  Eyes:     Pupils:     Right eye: Pupil is round and reactive.     Comments: Pt blind in LEFT eye  Neck:     Vascular: Hays carotid bruit.  Cardiovascular:     Rate and Rhythm: Normal rate and regular rhythm.     Pulses: Normal pulses.     Heart sounds: Normal heart sounds.  Pulmonary:     Effort: Pulmonary effort is normal.     Breath sounds: Normal breath sounds.  Abdominal:     General: Bowel sounds are normal.     Palpations: Abdomen is soft. There is Hays hepatomegaly, splenomegaly or mass.     Tenderness: There is Hays abdominal tenderness.     Hernia: Hays hernia is present.  Musculoskeletal:        General: Hays tenderness.     Cervical back: Neck supple.     Right lower leg: Hays edema.     Left lower leg: Hays edema.  Skin:    Findings: Lesion present. Hays rash.  Neurological:     Mental Status: She is alert and oriented to person, place, and time.     Motor: Hays weakness.    Psychiatric:        Mood and Affect: Mood and affect normal.        Behavior: Behavior normal.        Cognition and Memory: Memory is impaired.     BP (!) 159/97   Pulse 72   Ht 5'  5" (1.651 m)   Wt 208 lb (94.3 kg)   BMI 34.61 kg/m  Wt Readings from Last 3 Encounters:  04/03/20 208 lb (94.3 kg)  01/14/20 182 lb 4.8 oz (82.7 kg)  06/06/19 193 lb (87.5 kg)    Health Maintenance Due  Topic Date Due  . COVID-19 Vaccine (1) Never done  . TETANUS/TDAP  Never done  . DEXA SCAN  Never done  . PNA vac Low Risk Adult (1 of 2 - PCV13) Never done    There are Hays preventive care reminders to display for this patient.  CBC Latest Ref Rng & Units 04/03/2020 11/23/2015 11/16/2015  WBC 3.8 - 10.8 Thousand/uL 8.9 8.5 8.6  Hemoglobin 11.7 - 15.5 g/dL 14.5 15.7 13.8  Hematocrit 35 - 45 % 44.0 47.5(H) 41.0  Platelets 140 - 400 Thousand/uL 270 242 202   CMP Latest Ref Rng & Units 04/03/2020 11/23/2015 11/16/2015  Glucose 65 - 99 mg/dL 101(H) 114(H) 96  BUN 7 - 25 mg/dL 19 19 21(H)  Creatinine 0.60 - 0.88 mg/dL 1.47(H) 1.39(H) 1.19(H)  Sodium 135 - 146 mmol/L 143 140 139  Potassium 3.5 - 5.3 mmol/L 3.9 3.6 4.0  Chloride 98 - 110 mmol/L 106 102 107  CO2 20 - 32 mmol/L $RemoveB'24 30 28  'blmtGFOP$ Calcium 8.6 - 10.4 mg/dL 9.6 10.0 9.3  Total Protein 6.5 - 8.1 g/dL - 7.8 -  Total Bilirubin 0.3 - 1.2 mg/dL - 0.5 -  Alkaline Phos 38 - 126 U/L - 104 -  AST 15 - 41 U/L - 23 -  ALT 14 - 54 U/L - 22 -    Lab Results  Component Value Date   TSH 2.24 02/13/2014   Lab Results  Component Value Date   ALBUMIN 4.2 11/23/2015   ANIONGAP 8 11/23/2015   Hays results found for: CHOL, HDL, LDLCALC, CHOLHDL Hays results found for: TRIG Hays results found for: HGBA1C    ASSESSMENT & PLAN:   Problem List Items Addressed This Visit      Cardiovascular and Mediastinum   Chronic venous insufficiency    Has been evaluated by vascular with Hays new findings, mild swelling bilat.       Relevant Orders   Basic Metabolic  Panel (BMET) (Completed)   CBC (Completed)     Nervous and Auditory   Chronic left-sided low back pain with left-sided sciatica - Primary    Pt returns with request for medication refill for chronic pain. She is taking Hydrocodone sparingly and never within 8 hours of taking the Clonazepam. Pain has not progressed, radiates down R leg at times.       Relevant Medications   clonazePAM (KLONOPIN) 0.5 MG tablet   HYDROcodone-acetaminophen (NORCO/VICODIN) 5-325 MG tablet     Musculoskeletal and Integument   Eczema    Bilat legs without any signs of infection.       Relevant Medications   clobetasol cream (TEMOVATE) 0.05 %     Other   Memory loss    Member reports memory loss mild at this time, has difficulty recalling words. Alert ot time and place today.       Relevant Orders   Basic Metabolic Panel (BMET) (Completed)   CBC (Completed)   Need for influenza vaccination    Flu vaccine given.       Relevant Orders   Flu Vaccine QUAD High Dose(Fluad) (Completed)      Meds ordered this encounter  Medications  . clobetasol cream (TEMOVATE) 0.05 %  .  clonazePAM (KLONOPIN) 0.5 MG tablet    Sig: Take 1 tablet (0.5 mg total) by mouth daily.    Dispense:  30 tablet    Refill:  0  . HYDROcodone-acetaminophen (NORCO/VICODIN) 5-325 MG tablet    Sig: Take 0.5 tablets by mouth every 6 (six) hours as needed for moderate pain.    Dispense:  25 tablet    Refill:  0    Follow-up: Hays follow-ups on file.    Beckie Salts, [place] [address] [place], [place] [address]   By signing my name below, I, [employer], attest that this documentation has been prepared under the direction and in the presence of Beckie Salts, FNP Electronically Signed: Beckie Salts, [place] 04/04/20, 2:07 PM  I personally performed the services described in this documentation, which was SCRIBED in my presence. The recorded information has been reviewed and considered accurate. It has  been edited as necessary during review. Beckie Salts, FNP

## 2020-04-04 DIAGNOSIS — Z23 Encounter for immunization: Secondary | ICD-10-CM | POA: Insufficient documentation

## 2020-04-04 DIAGNOSIS — L309 Dermatitis, unspecified: Secondary | ICD-10-CM | POA: Insufficient documentation

## 2020-04-04 LAB — BASIC METABOLIC PANEL
BUN/Creatinine Ratio: 13 (calc) (ref 6–22)
BUN: 19 mg/dL (ref 7–25)
CO2: 24 mmol/L (ref 20–32)
Calcium: 9.6 mg/dL (ref 8.6–10.4)
Chloride: 106 mmol/L (ref 98–110)
Creat: 1.47 mg/dL — ABNORMAL HIGH (ref 0.60–0.88)
Glucose, Bld: 101 mg/dL — ABNORMAL HIGH (ref 65–99)
Potassium: 3.9 mmol/L (ref 3.5–5.3)
Sodium: 143 mmol/L (ref 135–146)

## 2020-04-04 LAB — CBC
HCT: 44 % (ref 35.0–45.0)
Hemoglobin: 14.5 g/dL (ref 11.7–15.5)
MCH: 29.1 pg (ref 27.0–33.0)
MCHC: 33 g/dL (ref 32.0–36.0)
MCV: 88.4 fL (ref 80.0–100.0)
MPV: 10.7 fL (ref 7.5–12.5)
Platelets: 270 10*3/uL (ref 140–400)
RBC: 4.98 10*6/uL (ref 3.80–5.10)
RDW: 13.3 % (ref 11.0–15.0)
WBC: 8.9 10*3/uL (ref 3.8–10.8)

## 2020-04-04 NOTE — Assessment & Plan Note (Signed)
Member reports memory loss mild at this time, has difficulty recalling words. Alert ot time and place today.

## 2020-04-04 NOTE — Assessment & Plan Note (Signed)
Has been evaluated by vascular with no new findings, mild swelling bilat.

## 2020-04-04 NOTE — Assessment & Plan Note (Signed)
Bilat legs without any signs of infection.

## 2020-04-04 NOTE — Assessment & Plan Note (Signed)
Flu vaccine given.

## 2020-04-04 NOTE — Assessment & Plan Note (Signed)
Pt returns with request for medication refill for chronic pain. She is taking Hydrocodone sparingly and never within 8 hours of taking the Clonazepam. Pain has not progressed, radiates down R leg at times.

## 2020-04-17 ENCOUNTER — Ambulatory Visit: Payer: Medicare Other | Attending: Internal Medicine

## 2020-04-17 DIAGNOSIS — Z23 Encounter for immunization: Secondary | ICD-10-CM

## 2020-04-17 NOTE — Progress Notes (Signed)
   Covid-19 Vaccination Clinic  Name:  Jasmine Hays    MRN: [id number] DOB: [birth year]  04/17/2020  Jasmine Hays was observed post Covid-19 immunization for 15 minutes without incident. She was provided with Vaccine Information Sheet and instruction to access the V-Safe system.   Jasmine Hays was instructed to call 911 with any severe reactions post vaccine: Marland Kitchen Difficulty breathing  . Swelling of face and throat  . A fast heartbeat  . A bad rash all over body  . Dizziness and weakness

## 2020-04-18 ENCOUNTER — Other Ambulatory Visit: Payer: Self-pay | Admitting: Family Medicine

## 2020-04-21 ENCOUNTER — Other Ambulatory Visit: Payer: Self-pay | Admitting: Family Medicine

## 2020-05-06 ENCOUNTER — Ambulatory Visit: Payer: Medicare Other | Admitting: Internal Medicine

## 2020-05-11 ENCOUNTER — Other Ambulatory Visit: Payer: Self-pay

## 2020-05-11 ENCOUNTER — Emergency Department: Payer: Medicare Other

## 2020-05-11 ENCOUNTER — Emergency Department
Admission: EM | Admit: 2020-05-11 | Discharge: 2020-05-12 | Disposition: A | Discharge status: Home / Self Care | Payer: Medicare Other | Attending: Emergency Medicine | Admitting: Emergency Medicine

## 2020-05-11 DIAGNOSIS — Z9119 Patient's noncompliance with other medical treatment and regimen: Secondary | ICD-10-CM | POA: Insufficient documentation

## 2020-05-11 DIAGNOSIS — R45851 Suicidal ideations: Secondary | ICD-10-CM | POA: Insufficient documentation

## 2020-05-11 DIAGNOSIS — F17210 Nicotine dependence, cigarettes, uncomplicated: Secondary | ICD-10-CM | POA: Insufficient documentation

## 2020-05-11 DIAGNOSIS — Z20822 Contact with and (suspected) exposure to covid-19: Secondary | ICD-10-CM | POA: Diagnosis not present

## 2020-05-11 DIAGNOSIS — F03A0 Unspecified dementia, mild, without behavioral disturbance, psychotic disturbance, mood disturbance, and anxiety: Secondary | ICD-10-CM

## 2020-05-11 DIAGNOSIS — Z046 Encounter for general psychiatric examination, requested by authority: Secondary | ICD-10-CM | POA: Diagnosis not present

## 2020-05-11 DIAGNOSIS — F32A Depression, unspecified: Secondary | ICD-10-CM | POA: Diagnosis not present

## 2020-05-11 DIAGNOSIS — Z91199 Patient's noncompliance with other medical treatment and regimen due to unspecified reason: Secondary | ICD-10-CM

## 2020-05-11 DIAGNOSIS — F322 Major depressive disorder, single episode, severe without psychotic features: Secondary | ICD-10-CM

## 2020-05-11 DIAGNOSIS — F0390 Unspecified dementia without behavioral disturbance: Secondary | ICD-10-CM | POA: Diagnosis not present

## 2020-05-11 DIAGNOSIS — I77810 Thoracic aortic ectasia: Secondary | ICD-10-CM | POA: Diagnosis not present

## 2020-05-11 DIAGNOSIS — F4310 Post-traumatic stress disorder, unspecified: Secondary | ICD-10-CM

## 2020-05-11 LAB — COMPREHENSIVE METABOLIC PANEL
ALT: 10 U/L (ref 0–44)
AST: 14 U/L — ABNORMAL LOW (ref 15–41)
Albumin: 3.7 g/dL (ref 3.5–5.0)
Alkaline Phosphatase: 90 U/L (ref 38–126)
Anion gap: 11 (ref 5–15)
BUN: 16 mg/dL (ref 8–23)
CO2: 25 mmol/L (ref 22–32)
Calcium: 9.4 mg/dL (ref 8.9–10.3)
Chloride: 106 mmol/L (ref 98–111)
Creatinine, Ser: 1.5 mg/dL — ABNORMAL HIGH (ref 0.44–1.00)
GFR, Estimated: 33 mL/min — ABNORMAL LOW (ref >60)
Glucose, Bld: 101 mg/dL — ABNORMAL HIGH (ref 70–99)
Potassium: 3.8 mmol/L (ref 3.5–5.1)
Sodium: 142 mmol/L (ref 135–145)
Total Bilirubin: 0.6 mg/dL (ref 0.3–1.2)
Total Protein: 7.2 g/dL (ref 6.5–8.1)

## 2020-05-11 LAB — CBC
HCT: 45.2 % (ref 36.0–46.0)
Hemoglobin: 14.6 g/dL (ref 12.0–15.0)
MCH: 29.2 pg (ref 26.0–34.0)
MCHC: 32.3 g/dL (ref 30.0–36.0)
MCV: 90.4 fL (ref 80.0–100.0)
Platelets: 255 10*3/uL (ref 150–400)
RBC: 5 MIL/uL (ref 3.87–5.11)
RDW: 13.4 % (ref 11.5–15.5)
WBC: 7.5 10*3/uL (ref 4.0–10.5)
nRBC: 0 % (ref 0.0–0.2)

## 2020-05-11 LAB — RESPIRATORY PANEL BY RT PCR (FLU A&B, COVID)
Influenza A by PCR: NEGATIVE
Influenza B by PCR: NEGATIVE
SARS Coronavirus 2 by RT PCR: NEGATIVE

## 2020-05-11 LAB — ACETAMINOPHEN LEVEL: Acetaminophen (Tylenol), Serum: <10 ug/mL — ABNORMAL LOW (ref 10–30)

## 2020-05-11 LAB — ETHANOL: Alcohol, Ethyl (B): <10 mg/dL (ref <10)

## 2020-05-11 LAB — SALICYLATE LEVEL: Salicylate Lvl: <7 mg/dL — ABNORMAL LOW (ref 7.0–30.0)

## 2020-05-11 MED ORDER — PAROXETINE HCL 20 MG PO TABS
20.0000 mg | ORAL_TABLET | Freq: Every day | ORAL | Status: DC
  Administered 2020-05-11 – 2020-05-12 (×2): 20 mg via ORAL
  Filled 2020-05-11 (×2): qty 1

## 2020-05-11 MED ORDER — ALBUTEROL SULFATE HFA 108 (90 BASE) MCG/ACT IN AERS
2.0000 | INHALATION_SPRAY | Freq: Four times a day (QID) | RESPIRATORY_TRACT | Status: DC | PRN
  Filled 2020-05-11: qty 6.7

## 2020-05-11 MED ORDER — ROPINIROLE HCL 1 MG PO TABS
2.0000 mg | ORAL_TABLET | Freq: Every day | ORAL | Status: DC
  Administered 2020-05-11: 2 mg via ORAL
  Filled 2020-05-11: qty 2

## 2020-05-11 MED ORDER — MOMETASONE FURO-FORMOTEROL FUM 100-5 MCG/ACT IN AERO
2.0000 | INHALATION_SPRAY | Freq: Two times a day (BID) | RESPIRATORY_TRACT | Status: DC
  Administered 2020-05-12: 2 via RESPIRATORY_TRACT
  Filled 2020-05-11: qty 8.8

## 2020-05-11 MED ORDER — FUROSEMIDE 40 MG PO TABS
20.0000 mg | ORAL_TABLET | Freq: Every day | ORAL | Status: DC
  Administered 2020-05-12: 20 mg via ORAL
  Filled 2020-05-11: qty 1

## 2020-05-11 MED ORDER — PRAVASTATIN SODIUM 20 MG PO TABS
20.0000 mg | ORAL_TABLET | Freq: Every day | ORAL | Status: DC
  Administered 2020-05-11 – 2020-05-12 (×2): 20 mg via ORAL
  Filled 2020-05-11 (×2): qty 1

## 2020-05-11 MED ORDER — LISINOPRIL 5 MG PO TABS
5.0000 mg | ORAL_TABLET | Freq: Every day | ORAL | Status: DC
  Administered 2020-05-12: 5 mg via ORAL
  Filled 2020-05-11: qty 1

## 2020-05-11 MED ORDER — CLONAZEPAM 0.5 MG PO TABS: 0.5000 mg | ORAL_TABLET | Freq: Every day | ORAL | Status: DC | PRN
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
## 2020-05-11 NOTE — ED Notes (Signed)
Medication Reconciliation Report  For Home History Technicians  HIGHLIGHTS:  1. The patient WAS personally interviewed 2. If not, what was the main source used: NOT APPLICABLE 3. Does the patient appear to take any anti-coagulation agents (e.g. warfarin, Eliquis or Xarelto): NO 4. Does the patient appear to take any anti-convulsant agents (e.g. divalproex, levetiracetam or phenytoin): NO 5. Does the patient appear to use any insulin products (e.g. Lantus, Novolin or Humalog): NO 6. Does the patient appear to take any "beta-blockers" (e.g. metoprolol, carvedilol or bisoprolol: NO  BARRIERS:  1. Were there any barriers that prevented or complicated the medication reconciliation process: YES 2. If yes, what was the primary barrier encountered: Poor historian 3. Does the patient appear compliant with prescribed medications: NO 4. Does the patient express any barriers with compliance: UNABLE TO DETERMINE 5. What is the primary barrier the patient reports: None   NOTES:[Include any concerns, remarks or complaints the patient expresses regarding medication therapy. Any observations or other information that might be useful to the treatment team can also be included. Immediate needs or concerns should be referred to the RN or appropriate member of the treatment team.]   Patient was personally interviewed. When asked whether she takes in medications at home, patient initially replied that she does not. However, after asking about specific medications, she admitted to taking some when she felt the need. Pharmacy records available to this technician suggest patient has been off PAROXETINE since at least July 2021. Most recent prescription for CLONAZEPAM from 04/03/2020 as was most recent RX for [place].    Colen Darling, CPhT [place] at [facility]. [place], [place] [address] [phone]  ** The above is intended solely for informational and/or  communicative purposes. It should in no way be considered an endorsement of any specific treatment, therapy or action. **

## 2020-05-11 NOTE — BH Assessment (Addendum)
Referral information for Psychiatric Hospitalization faxed to:   [facility] [phone])  . [facility] [phone], [phone]  or [phone])   [facility] [phone], [phone])   [place] [phone])   [place] [phone] or [phone]),   . [facility] [phone] -or- [phone]),   . [facility] [phone] or [phone])

## 2020-05-11 NOTE — BH Assessment (Addendum)
Late entry- TTS consulted with Dr. Cinda Quest and requested pt's UDS, EKG, UA & Chest xray. Order's placed.

## 2020-05-11 NOTE — ED Notes (Signed)
Pt's daughter called for glasses and teeth.

## 2020-05-11 NOTE — ED Notes (Signed)
IVC, pending geripsych

## 2020-05-11 NOTE — ED Notes (Signed)
Pt given meal tray.

## 2020-05-11 NOTE — ED Triage Notes (Signed)
Pt comes IVC by family. Paperwork states that pt has been refusing her meds and not wanting to take care of herself. Increasing insomnia and depression. Pt calm and cooperative at this time.   Belongings include gray sweatpants, gray long sleeve shirt, red jacket, slippers, fuzzy socks. 1/1 bag.
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
## 2020-05-12 DIAGNOSIS — F32A Depression, unspecified: Secondary | ICD-10-CM | POA: Diagnosis not present

## 2020-05-12 DIAGNOSIS — F322 Major depressive disorder, single episode, severe without psychotic features: Secondary | ICD-10-CM

## 2020-05-12 NOTE — ED Notes (Signed)
Patient is talking to son on the phone, she is calm and cooperative , will continue to monitor.

## 2020-05-12 NOTE — ED Notes (Signed)
Patient changed clothing to her street clothes, all belongings given back to patient, her son transported home, Patient voiced understanding of discharge instructions and was calm and cooperative.

## 2020-05-12 NOTE — ED Notes (Signed)
Pt was offered a shower. Pt refused at this time. Pt stated "I just want the lights off and door closed so I can sleep."

## 2020-05-12 NOTE — ED Provider Notes (Signed)
Emergency Medicine Observation Re-evaluation Note  Jasmine Hays is a 86 y.o. female, seen on rounds today.  Pt initially presented to the ED for complaints of Psychiatric Evaluation Currently, the patient is resting comfortably in a hallway bed.  Physical Exam  BP 133/60 (BP Location: Right Arm)   Pulse (!) 58   Temp 97.7 F (36.5 C) (Oral)   Resp 18   Ht 5\' 5"  (1.651 m)   Wt 94 kg   SpO2 100%   BMI 34.49 kg/m  Physical Exam Constitutional:      Appearance: She is not ill-appearing or toxic-appearing.  Eyes:     Extraocular Movements: Extraocular movements intact.     Pupils: Pupils are equal, round, and reactive to light.  Cardiovascular:     Rate and Rhythm: Normal rate.  Pulmonary:     Effort: Pulmonary effort is normal.  Abdominal:     General: There is no distension.  Skin:    General: Skin is warm and dry.  Neurological:     General: No focal deficit present.     Cranial Nerves: No cranial nerve deficit.      ED Course / MDM  EKG:    I have reviewed the labs performed to date as well as medications administered while in observation.  Recent changes in the last 24 hours include psychiatry eval, recommending inpatient psych disposition.  Plan  Current plan is for inpatient geri-psych disposition. Patient is under full IVC at this time.   Vladimir Crofts, MD 05/12/20 0005

## 2020-05-12 NOTE — Consult Note (Signed)
[facility] Psychiatry Consult   Reason for Consult: Follow-up consult 86 year old woman brought in yesterday under IVC for depression Referring Physician: Siddiqui Patient Identification: Jasmine Hays MRN:  [id number] Principal Diagnosis: Severe major depression, single episode, without psychotic features ([place]) Diagnosis:  Principal Problem:   Severe major depression, single episode, without psychotic features ([place]) Active Problems:   PTSD (post-traumatic stress disorder)   Mild dementia ([place])   Total Time spent with patient: 30 minutes  Subjective:   Jasmine Hays is a 86 y.o. female patient admitted with "I would like to go".  HPI: Follow-up note for this patient who was brought in with concerns about depression and mild dementia poor self-care.  Patient has been cooperative although somewhat irritable overnight.  Denies suicidal or homicidal thought.  Patient's daughter who filed a commitment paperwork called today and is requesting that the patient be discharged home.  She told me she feels there is no concern about suicidal or homicidal behavior and that she feels it is better and safer for the patient come back to her own home and then to sit here in the emergency room.  We reviewed the plan for her to get back into her primary care doctor and continue on her outpatient medicine.  At this point given that the family is willing to pick her up and take responsibility for they are no longer seems to be in need for commitment.  Past Psychiatric History: Past history of treatment for depression and dementia as an outpatient  Risk to Self:   Risk to Others:   Prior Inpatient Therapy:   Prior Outpatient Therapy:    Past Medical History:  Past Medical History:  Diagnosis Date  . Breast cancer [facility])     Past Surgical History:  Procedure Laterality Date  . ABDOMINAL HYSTERECTOMY    . BREAST SURGERY     lumpectomy   Family History: History reviewed. No pertinent family  history. Family Psychiatric  History: See previous Social History:  Social History   Substance and Sexual Activity  Alcohol Use No     Social History   Substance and Sexual Activity  Drug Use No    Social History   Socioeconomic History  . Marital status: Widowed    Spouse name: Not on file  . Number of children: Not on file  . Years of education: Not on file  . Highest education level: Not on file  Occupational History  . Not on file  Tobacco Use  . Smoking status: Current Every Day Smoker    Packs/day: 1.50    Types: Cigarettes  . Smokeless tobacco: Never Used  Substance and Sexual Activity  . Alcohol use: No  . Drug use: No  . Sexual activity: Not on file  Other Topics Concern  . Not on file  Social History Narrative  . Not on file   Social Determinants of Health   Financial Resource Strain:   . Difficulty of Paying Living Expenses: Not on file  Food Insecurity:   . Worried About Charity fundraiser in the Last Year: Not on file  . Ran Out of Food in the Last Year: Not on file  Transportation Needs:   . Lack of Transportation (Medical): Not on file  . Lack of Transportation (Non-Medical): Not on file  Physical Activity:   . Days of Exercise per Week: Not on file  . Minutes of Exercise per Session: Not on file  Stress:   . Feeling of Stress :  Not on file  Social Connections:   . Frequency of Communication with Friends and Family: Not on file  . Frequency of Social Gatherings with Friends and Family: Not on file  . Attends Religious Services: Not on file  . Active Member of Clubs or Organizations: Not on file  . Attends Archivist Meetings: Not on file  . Marital Status: Not on file   Additional Social History:    Allergies:  No Known Allergies  Labs:  Results for orders placed or performed during the hospital encounter of 05/11/20 (from the past 48 hour(s))  Comprehensive metabolic panel     Status: Abnormal   Collection Time: 05/11/20   2:29 PM  Result Value Ref Range   Sodium 142 135 - 145 mmol/L   Potassium 3.8 3.5 - 5.1 mmol/L   Chloride 106 98 - 111 mmol/L   CO2 25 22 - 32 mmol/L   Glucose, Bld 101 (H) 70 - 99 mg/dL    Comment: Glucose reference range applies only to samples taken after fasting for at least 8 hours.   BUN 16 8 - 23 mg/dL   Creatinine, Ser 1.50 (H) 0.44 - 1.00 mg/dL   Calcium 9.4 8.9 - 10.3 mg/dL   Total Protein 7.2 6.5 - 8.1 g/dL   Albumin 3.7 3.5 - 5.0 g/dL   AST 14 (L) 15 - 41 U/L   ALT 10 0 - 44 U/L   Alkaline Phosphatase 90 38 - 126 U/L   Total Bilirubin 0.6 0.3 - 1.2 mg/dL   GFR, Estimated 33 (L) >60 mL/min    Comment: (NOTE) Calculated using the CKD-EPI Creatinine Equation (2021)    Anion gap 11 5 - 15    Comment: Performed at [facility], [address] [place], [place] [address]  Ethanol     Status: None   Collection Time: 05/11/20  2:29 PM  Result Value Ref Range   Alcohol, Ethyl (B) <10 <10 mg/dL    Comment: (NOTE) Lowest detectable limit for serum alcohol is 10 mg/dL.  For medical purposes only. Performed at [facility], [address] [place], [place] [address]   Salicylate level     Status: Abnormal   Collection Time: 05/11/20  2:29 PM  Result Value Ref Range   Salicylate Lvl <3.2 (L) 7.0 - 30.0 mg/dL    Comment: Performed at [facility], [address] [place], [place] [address]  Acetaminophen level     Status: Abnormal   Collection Time: 05/11/20  2:29 PM  Result Value Ref Range   Acetaminophen (Tylenol), Serum <10 (L) 10 - 30 ug/mL    Comment: (NOTE) Therapeutic concentrations vary significantly. A range of 10-30 ug/mL  may be an effective concentration for many patients. However, some  are best treated at concentrations outside of this range. Acetaminophen concentrations >150 ug/mL at 4 hours after ingestion  and >50 ug/mL at 12 hours after ingestion are often associated with  toxic reactions.  Performed at [facility], [address] [place], [place] [address]   cbc     Status: None   Collection Time: 05/11/20  2:29 PM  Result Value Ref Range   WBC 7.5 4.0 - 10.5 K/uL   RBC 5.00 3.87 - 5.11 MIL/uL   Hemoglobin 14.6 12.0 - 15.0 g/dL   HCT 45.2 36 - 46 %   MCV 90.4 80.0 - 100.0 fL   MCH 29.2 26.0 - 34.0 pg   MCHC 32.3 30.0 -  36.0 g/dL   RDW 13.4 11.5 - 15.5 %   Platelets 255 150 - 400 K/uL   nRBC 0.0 0.0 - 0.2 %    Comment: Performed at [facility], [address] [place], [place] [address]  Respiratory Panel by RT PCR (Flu A&B, Covid) - Nasopharyngeal Swab     Status: None   Collection Time: 05/11/20  8:32 PM   Specimen: Nasopharyngeal Swab  Result Value Ref Range   SARS Coronavirus 2 by RT PCR NEGATIVE NEGATIVE    Comment: (NOTE) SARS-CoV-2 target nucleic acids are NOT DETECTED.  The SARS-CoV-2 RNA is generally detectable in upper respiratoy specimens during the acute phase of infection. The lowest concentration of SARS-CoV-2 viral copies this assay can detect is 131 copies/mL. A negative result does not preclude SARS-Cov-2 infection and should not be used as the sole basis for treatment or other patient management decisions. A negative result may occur with  improper specimen collection/handling, submission of specimen other than nasopharyngeal swab, presence of viral mutation(s) within the areas targeted by this assay, and inadequate number of viral copies (<131 copies/mL). A negative result must be combined with clinical observations, patient history, and epidemiological information. The expected result is Negative.  Fact Sheet for Patients:  PinkCheek.be  Fact Sheet for Healthcare Providers:  GravelBags.it  This test is no t yet approved or cleared by the [place] FDA and  has been authorized for detection and/or diagnosis of SARS-CoV-2 by FDA under an Emergency Use Authorization (EUA). This EUA will  remain  in effect (meaning this test can be used) for the duration of the COVID-19 declaration under Section 564(b)(1) of the Act, 21 U.S.C. section 360bbb-3(b)(1), unless the authorization is terminated or revoked sooner.     Influenza A by PCR NEGATIVE NEGATIVE   Influenza B by PCR NEGATIVE NEGATIVE    Comment: (NOTE) The Xpert Xpress SARS-CoV-2/FLU/RSV assay is intended as an aid in  the diagnosis of influenza from Nasopharyngeal swab specimens and  should not be used as a sole basis for treatment. Nasal washings and  aspirates are unacceptable for Xpert Xpress SARS-CoV-2/FLU/RSV  testing.  Fact Sheet for Patients: PinkCheek.be  Fact Sheet for Healthcare Providers: GravelBags.it  This test is not yet approved or cleared by the [place] FDA and  has been authorized for detection and/or diagnosis of SARS-CoV-2 by  FDA under an Emergency Use Authorization (EUA). This EUA will remain  in effect (meaning this test can be used) for the duration of the  Covid-19 declaration under Section 564(b)(1) of the Act, 21  U.S.C. section 360bbb-3(b)(1), unless the authorization is  terminated or revoked. Performed at [facility], [address] [place],  [address]     Current Facility-Administered Medications  Medication Dose Route Frequency Provider Last Rate Last Admin  . albuterol (VENTOLIN HFA) 108 (90 Base) MCG/ACT inhaler 2 puff  2 puff Inhalation Q6H PRN Marvis Bakken T, MD      . clonazePAM (KLONOPIN) tablet 0.5 mg  0.5 mg Oral Daily PRN Baylei Siebels T, MD      . furosemide (LASIX) tablet 20 mg  20 mg Oral Daily Roma Bierlein T, MD   20 mg at 05/12/20 1004  . lisinopril (ZESTRIL) tablet 5 mg  5 mg Oral Daily Kathye Cipriani T, MD   5 mg at 05/12/20 1005  . mometasone-formoterol (DULERA) 100-5 MCG/ACT inhaler 2 puff  2 puff Inhalation BID Hao Dion, Madie Reno, MD   2 puff at 05/12/20 1006  .  PARoxetine  (PAXIL) tablet 20 mg  20 mg Oral Daily Constancia Geeting T, MD   20 mg at 05/12/20 1007  . pravastatin (PRAVACHOL) tablet 20 mg  20 mg Oral Daily Chetan Mehring T, MD   20 mg at 05/12/20 1007  . rOPINIRole (REQUIP) tablet 2 mg  2 mg Oral QHS Kerigan Narvaez, Madie Reno, MD   2 mg at 05/11/20 2101   Current Outpatient Medications  Medication Sig Dispense Refill  . albuterol (PROVENTIL) (2.5 MG/3ML) 0.083% nebulizer solution Take 3 mLs (2.5 mg total) by nebulization every 6 (six) hours as needed for wheezing or shortness of breath. 150 mL 6  . clobetasol ointment (TEMOVATE) 2.20 % Apply 1 application topically in the morning and at bedtime.    . clonazePAM (KLONOPIN) 0.5 MG tablet TAKE 1 TABLET BY MOUTH DAILY (Patient taking differently: Take 0.5 mg by mouth daily. ) 30 tablet 0  . HYDROcodone-acetaminophen (NORCO/VICODIN) 5-325 MG tablet Take 0.5 tablets by mouth every 6 (six) hours as needed for moderate pain. 25 tablet 0  . umeclidinium-vilanterol (ANORO ELLIPTA) 62.5-25 MCG/INH AEPB Inhale 1 puff into the lungs daily as needed (respiratory symptoms).     . furosemide (LASIX) 20 MG tablet Take 20 mg by mouth daily. (Patient not taking: Reported on 05/11/2020)    . PARoxetine (PAXIL) 20 MG tablet Take 1 tablet (20 mg total) by mouth daily. (Patient not taking: Reported on 05/11/2020) 30 tablet 6  . pravastatin (PRAVACHOL) 20 MG tablet Take 20 mg by mouth daily. (Patient not taking: Reported on 05/11/2020)    . Respiratory Therapy Supplies (NEBULIZER/TUBING/MOUTHPIECE) KIT Use with nebulizer treatment 6 kit 3  . rOPINIRole (REQUIP) 2 MG tablet Take 2 mg by mouth 2 (two) times a day. (Patient not taking: Reported on 05/11/2020)      Musculoskeletal: Strength & Muscle Tone: within normal limits Gait & Station: unsteady Patient leans: N/A  Psychiatric Specialty Exam: Physical Exam Vitals and nursing note reviewed.  Constitutional:      Appearance: She is well-developed.  HENT:     Head: Normocephalic and  atraumatic.  Eyes:     Conjunctiva/sclera: Conjunctivae normal.     Pupils: Pupils are equal, round, and reactive to light.  Cardiovascular:     Heart sounds: Normal heart sounds.  Pulmonary:     Effort: Pulmonary effort is normal.  Abdominal:     Palpations: Abdomen is soft.  Musculoskeletal:        General: Normal range of motion.     Cervical back: Normal range of motion.  Skin:    General: Skin is warm and dry.  Neurological:     Mental Status: She is alert.  Psychiatric:        Attention and Perception: She is inattentive.        Mood and Affect: Mood is anxious.        Speech: Speech is delayed.        Behavior: Behavior is slowed.        Thought Content: Thought content does not include homicidal or suicidal ideation.        Cognition and Memory: Memory is impaired.     Review of Systems  Constitutional: Positive for fatigue.  HENT: Negative.   Eyes: Negative.   Respiratory: Negative.   Cardiovascular: Negative.   Gastrointestinal: Negative.   Musculoskeletal: Negative.   Skin: Negative.   Neurological: Negative.   Psychiatric/Behavioral: Positive for dysphoric mood and sleep disturbance.    Blood pressure (!) 162/97, pulse  91, temperature 97.6 F (36.4 C), temperature source Oral, resp. rate 16, height $RemoveBe'5\' 5"'AljktwsIt$  (1.651 m), weight 94 kg, SpO2 97 %.Body mass index is 34.49 kg/m.  General Appearance: Disheveled  Eye Contact:  Minimal  Speech:  Slow  Volume:  Decreased  Mood:  Depressed  Affect:  Congruent  Thought Process:  Goal Directed  Orientation:  Full (Time, Place, and Person)  Thought Content:  Logical  Suicidal Thoughts:  No  Homicidal Thoughts:  No  Memory:  Immediate;   Fair Recent;   Poor Remote;   Fair  Judgement:  Fair  Insight:  Fair  Psychomotor Activity:  Normal  Concentration:  Concentration: Fair  Recall:  [employer] of Knowledge:  Fair  Language:  Fair  Akathisia:  No  Handed:  Right  AIMS (if indicated):     Assets:  Desire for  Improvement Housing Resilience  ADL's:  Impaired  Cognition:  Impaired,  Mild  Sleep:        Treatment Plan Summary: Plan As noted yesterday patient appears to be resistant to appropriate self-care and resistant to medicine and having multiple symptoms of depression and PTSD.  She is not however voicing any suicidal ideation and today indicates more willingness to be cooperative.  Family both daughter and son have indicated specific request that she come back home and they feel that is safe and they will make sure she gets outpatient follow-up.  In that situation we no longer require IVC and I will discontinue the paperwork.  Case reviewed with emergency room and TTS.  Disposition: Supportive therapy provided about ongoing stressors.  Alethia Berthold, MD 05/12/2020 12:03 PM

## 2020-05-12 NOTE — Discharge Instructions (Addendum)
Return to the ER for any new or worsening symptoms that are concerning, such as worsening behavioral disturbance, self-harm, agitation, confusion or change in mental status, or any other new or worsening symptoms.

## 2020-05-12 NOTE — ED Notes (Signed)
Patient given breakfast tray.

## 2020-05-12 NOTE — ED Notes (Signed)
Patient given peanut butter, crackers and water.

## 2020-05-12 NOTE — ED Provider Notes (Signed)
-----------------------------------------   2:46 PM on 05/12/2020 -----------------------------------------  The patient has been evaluated by Dr. Weber Cooks and cleared for discharge.  The family feels that the patient is not a danger to herself, and would be safer at home.  They will pursue possible placement through the PMD.  At this time, the patient is stable for discharge home.  Return precautions have been provided.  Dr. Weber Cooks and the ED RN communicated with the patient's family.   Arta Silence, MD 05/12/20 1446

## 2020-05-20 ENCOUNTER — Ambulatory Visit: Payer: Medicare Other | Admitting: Internal Medicine

## 2020-05-27 ENCOUNTER — Ambulatory Visit: Payer: Medicare Other | Admitting: Family Medicine

## 2020-06-12 ENCOUNTER — Encounter: Payer: Self-pay | Admitting: Family Medicine

## 2020-06-12 ENCOUNTER — Other Ambulatory Visit: Payer: Self-pay

## 2020-06-12 ENCOUNTER — Ambulatory Visit (INDEPENDENT_AMBULATORY_CARE_PROVIDER_SITE_OTHER): Payer: Medicare Other | Admitting: Family Medicine

## 2020-06-12 VITALS — BP 132/83 | HR 106 | Ht 65.0 in | Wt 176.4 lb

## 2020-06-12 DIAGNOSIS — I83019 Varicose veins of right lower extremity with ulcer of unspecified site: Secondary | ICD-10-CM

## 2020-06-12 DIAGNOSIS — L97919 Non-pressure chronic ulcer of unspecified part of right lower leg with unspecified severity: Secondary | ICD-10-CM

## 2020-06-12 DIAGNOSIS — G2581 Restless legs syndrome: Secondary | ICD-10-CM | POA: Diagnosis not present

## 2020-06-12 MED ORDER — CEPHALEXIN 500 MG PO CAPS: 500.0000 mg | ORAL_CAPSULE | Freq: Four times a day (QID) | ORAL | 0 refills | Status: DC

## 2020-06-12 MED ORDER — CLONAZEPAM 0.5 MG PO TABS
0.5000 mg | ORAL_TABLET | Freq: Every day | ORAL | 0 refills | Status: DC
Start: 2020-06-12 — End: 2020-10-20

## 2020-06-12 MED ORDER — ROPINIROLE HCL 2 MG PO TABS
2.0000 mg | ORAL_TABLET | Freq: Two times a day (BID) | ORAL | 3 refills | Status: DC
Start: 2020-06-12 — End: 2020-10-20

## 2020-06-12 NOTE — Assessment & Plan Note (Signed)
R leg ulcer anterior lower R leg with erythema, daughter has been caring for wound and noticed purulent drainage.  Plan- Continue to monitor, Keflex abx for now.

## 2020-06-12 NOTE — Progress Notes (Signed)
Established Patient Office Visit  SUBJECTIVE:  Subjective  Patient ID: Jasmine Hays, female    DOB: Sep 22, 1929  Age: 86 y.o. MRN: [id number]  CC:  Chief Complaint  Patient presents with  . Leg Pain    HPI Jasmine Hays is a 86 y.o. female presenting today for     Past Medical History:  Diagnosis Date  . Breast cancer [facility])     Past Surgical History:  Procedure Laterality Date  . ABDOMINAL HYSTERECTOMY    . BREAST SURGERY     lumpectomy    History reviewed. No pertinent family history.  Social History   Socioeconomic History  . Marital status: Widowed    Spouse name: Not on file  . Number of children: Not on file  . Years of education: Not on file  . Highest education level: Not on file  Occupational History  . Not on file  Tobacco Use  . Smoking status: Current Every Day Smoker    Packs/day: 1.50    Types: Cigarettes  . Smokeless tobacco: Never Used  Substance and Sexual Activity  . Alcohol use: No  . Drug use: No  . Sexual activity: Not on file  Other Topics Concern  . Not on file  Social History Narrative  . Not on file   Social Determinants of Health   Financial Resource Strain: Not on file  Food Insecurity: Not on file  Transportation Needs: Not on file  Physical Activity: Not on file  Stress: Not on file  Social Connections: Not on file  Intimate Partner Violence: Not on file     Current Outpatient Medications:  .  albuterol (PROVENTIL) (2.5 MG/3ML) 0.083% nebulizer solution, Take 3 mLs (2.5 mg total) by nebulization every 6 (six) hours as needed for wheezing or shortness of breath., Disp: 150 mL, Rfl: 6 .  clobetasol ointment (TEMOVATE) 0.25 %, Apply 1 application topically in the morning and at bedtime., Disp: , Rfl:  .  clonazePAM (KLONOPIN) 0.5 MG tablet, TAKE 1 TABLET BY MOUTH DAILY (Patient taking differently: Take 0.5 mg by mouth daily.), Disp: 30 tablet, Rfl: 0 .  furosemide (LASIX) 20 MG tablet, Take 20 mg by mouth daily., Disp:  , Rfl:  .  HYDROcodone-acetaminophen (NORCO/VICODIN) 5-325 MG tablet, Take 0.5 tablets by mouth every 6 (six) hours as needed for moderate pain., Disp: 25 tablet, Rfl: 0 .  PARoxetine (PAXIL) 20 MG tablet, Take 1 tablet (20 mg total) by mouth daily., Disp: 30 tablet, Rfl: 6 .  pravastatin (PRAVACHOL) 20 MG tablet, Take 20 mg by mouth daily., Disp: , Rfl:  .  Respiratory Therapy Supplies (NEBULIZER/TUBING/MOUTHPIECE) KIT, Use with nebulizer treatment, Disp: 6 kit, Rfl: 3 .  rOPINIRole (REQUIP) 2 MG tablet, Take 2 mg by mouth 2 (two) times a day., Disp: , Rfl:  .  umeclidinium-vilanterol (ANORO ELLIPTA) 62.5-25 MCG/INH AEPB, Inhale 1 puff into the lungs daily as needed (respiratory symptoms). , Disp: , Rfl:  .  cephALEXin (KEFLEX) 500 MG capsule, Take 1 capsule (500 mg total) by mouth 4 (four) times daily., Disp: 28 capsule, Rfl: 0   No Known Allergies  ROS Review of Systems  Constitutional: Negative.   HENT: Negative.   Musculoskeletal: Negative.   Skin: Positive for wound.  Neurological: Negative.   Psychiatric/Behavioral: Negative.      OBJECTIVE:    Physical Exam Vitals and nursing note reviewed.  Constitutional:      Appearance: Normal appearance.  HENT:     Head: Normocephalic and  atraumatic.  Eyes:     Pupils: Pupils are equal, round, and reactive to light.  Cardiovascular:     Rate and Rhythm: Normal rate.  Pulmonary:     Effort: Pulmonary effort is normal.  Abdominal:     General: Abdomen is flat.  Musculoskeletal:        General: Normal range of motion.  Skin:    General: Skin is warm.     Capillary Refill: Capillary refill takes less than 2 seconds.     Comments: Dime size ulcer R leg.   Neurological:     General: No focal deficit present.     Mental Status: She is alert.  Psychiatric:        Mood and Affect: Mood normal.     BP 132/83   Pulse (!) 106   Ht $R'5\' 5"'fI$  (1.651 m)   Wt 176 lb 6.4 oz (80 kg)   BMI 29.35 kg/m  Wt Readings from Last 3 Encounters:   06/12/20 176 lb 6.4 oz (80 kg)  05/11/20 207 lb 3.7 oz (94 kg)  04/03/20 208 lb (94.3 kg)    Health Maintenance Due  Topic Date Due  . TETANUS/TDAP  Never done  . DEXA SCAN  Never done  . PNA vac Low Risk Adult (1 of 2 - PCV13) Never done  . COVID-19 Vaccine (2 - Pfizer 3-dose booster series) 05/08/2020    There are no preventive care reminders to display for this patient.  CBC Latest Ref Rng & Units 05/11/2020 04/03/2020 11/23/2015  WBC 4.0 - 10.5 K/uL 7.5 8.9 8.5  Hemoglobin 12.0 - 15.0 g/dL 14.6 14.5 15.7  Hematocrit 36.0 - 46.0 % 45.2 44.0 47.5(H)  Platelets 150 - 400 K/uL 255 270 242   CMP Latest Ref Rng & Units 05/11/2020 04/03/2020 11/23/2015  Glucose 70 - 99 mg/dL 101(H) 101(H) 114(H)  BUN 8 - 23 mg/dL $Remove'16 19 19  'nMpRBNL$ Creatinine 0.44 - 1.00 mg/dL 1.50(H) 1.47(H) 1.39(H)  Sodium 135 - 145 mmol/L 142 143 140  Potassium 3.5 - 5.1 mmol/L 3.8 3.9 3.6  Chloride 98 - 111 mmol/L 106 106 102  CO2 22 - 32 mmol/L $RemoveB'25 24 30  'LtTyMNhR$ Calcium 8.9 - 10.3 mg/dL 9.4 9.6 10.0  Total Protein 6.5 - 8.1 g/dL 7.2 - 7.8  Total Bilirubin 0.3 - 1.2 mg/dL 0.6 - 0.5  Alkaline Phos 38 - 126 U/L 90 - 104  AST 15 - 41 U/L 14(L) - 23  ALT 0 - 44 U/L 10 - 22    Lab Results  Component Value Date   TSH 2.24 02/13/2014   Lab Results  Component Value Date   ALBUMIN 3.7 05/11/2020   ANIONGAP 11 05/11/2020   No results found for: CHOL, HDL, LDLCALC, CHOLHDL No results found for: TRIG No results found for: HGBA1C    ASSESSMENT & PLAN:   Problem List Items Addressed This Visit      Musculoskeletal and Integument   Venous ulcer of right leg (HCC)    R leg ulcer anterior lower R leg with erythema, daughter has been caring for wound and noticed purulent drainage.  Plan- Continue to monitor, Keflex abx for now.         Other   Restless leg syndrome - Primary    Refil medication today, medication will keep her from cratching as much at night.          Meds ordered this encounter  Medications  .  cephALEXin (KEFLEX) 500 MG capsule  Sig: Take 1 capsule (500 mg total) by mouth 4 (four) times daily.    Dispense:  28 capsule    Refill:  0      Follow-up: No follow-ups on file.    Beckie Salts, [place] [address] [place], [place] [address]

## 2020-06-12 NOTE — Assessment & Plan Note (Signed)
Refil medication today, medication will keep her from cratching as much at night.

## 2020-08-21 ENCOUNTER — Ambulatory Visit: Payer: Medicare Other | Admitting: Family Medicine

## 2020-10-19 ENCOUNTER — Other Ambulatory Visit: Payer: Self-pay

## 2020-10-19 ENCOUNTER — Encounter: Payer: Self-pay | Admitting: Emergency Medicine

## 2020-10-19 ENCOUNTER — Emergency Department
Admission: EM | Admit: 2020-10-19 | Discharge: 2020-10-20 | Disposition: A | Discharge status: Home / Self Care | Payer: Medicare Other | Attending: Emergency Medicine | Admitting: Emergency Medicine

## 2020-10-19 DIAGNOSIS — F4320 Adjustment disorder, unspecified: Secondary | ICD-10-CM | POA: Insufficient documentation

## 2020-10-19 DIAGNOSIS — F0391 Unspecified dementia with behavioral disturbance: Secondary | ICD-10-CM | POA: Insufficient documentation

## 2020-10-19 DIAGNOSIS — Z79899 Other long term (current) drug therapy: Secondary | ICD-10-CM | POA: Diagnosis not present

## 2020-10-19 DIAGNOSIS — R4689 Other symptoms and signs involving appearance and behavior: Secondary | ICD-10-CM

## 2020-10-19 DIAGNOSIS — F17210 Nicotine dependence, cigarettes, uncomplicated: Secondary | ICD-10-CM | POA: Diagnosis not present

## 2020-10-19 DIAGNOSIS — Z046 Encounter for general psychiatric examination, requested by authority: Secondary | ICD-10-CM | POA: Diagnosis not present

## 2020-10-19 DIAGNOSIS — Z853 Personal history of malignant neoplasm of breast: Secondary | ICD-10-CM | POA: Diagnosis not present

## 2020-10-19 DIAGNOSIS — Z20822 Contact with and (suspected) exposure to covid-19: Secondary | ICD-10-CM | POA: Insufficient documentation

## 2020-10-19 LAB — RESP PANEL BY RT-PCR (FLU A&B, COVID) ARPGX2
Influenza A by PCR: NEGATIVE
Influenza B by PCR: NEGATIVE
SARS Coronavirus 2 by RT PCR: NEGATIVE

## 2020-10-19 LAB — COMPREHENSIVE METABOLIC PANEL
ALT: 11 U/L (ref 0–44)
AST: 16 U/L (ref 15–41)
Albumin: 4 g/dL (ref 3.5–5.0)
Alkaline Phosphatase: 90 U/L (ref 38–126)
Anion gap: 9 (ref 5–15)
BUN: 20 mg/dL (ref 8–23)
CO2: 23 mmol/L (ref 22–32)
Calcium: 9.6 mg/dL (ref 8.9–10.3)
Chloride: 107 mmol/L (ref 98–111)
Creatinine, Ser: 1.19 mg/dL — ABNORMAL HIGH (ref 0.44–1.00)
GFR, Estimated: 44 mL/min — ABNORMAL LOW (ref >60)
Glucose, Bld: 97 mg/dL (ref 70–99)
Potassium: 3.8 mmol/L (ref 3.5–5.1)
Sodium: 139 mmol/L (ref 135–145)
Total Bilirubin: 0.5 mg/dL (ref 0.3–1.2)
Total Protein: 7.4 g/dL (ref 6.5–8.1)

## 2020-10-19 LAB — CBC
HCT: 43.8 % (ref 36.0–46.0)
Hemoglobin: 14.6 g/dL (ref 12.0–15.0)
MCH: 29.4 pg (ref 26.0–34.0)
MCHC: 33.3 g/dL (ref 30.0–36.0)
MCV: 88.1 fL (ref 80.0–100.0)
Platelets: 258 10*3/uL (ref 150–400)
RBC: 4.97 MIL/uL (ref 3.87–5.11)
RDW: 13.5 % (ref 11.5–15.5)
WBC: 8.2 10*3/uL (ref 4.0–10.5)
nRBC: 0 % (ref 0.0–0.2)

## 2020-10-19 LAB — SALICYLATE LEVEL: Salicylate Lvl: <7 mg/dL — ABNORMAL LOW (ref 7.0–30.0)

## 2020-10-19 LAB — ACETAMINOPHEN LEVEL: Acetaminophen (Tylenol), Serum: <10 ug/mL — ABNORMAL LOW (ref 10–30)

## 2020-10-19 LAB — ETHANOL: Alcohol, Ethyl (B): <10 mg/dL (ref <10)

## 2020-10-19 NOTE — ED Notes (Signed)
1 pair [employer] tennis shoes 1 pair pajama pants 1 pajama shirt  Pt belongings given to patient's son.

## 2020-10-19 NOTE — ED Notes (Signed)
Pt given [place] sandwich tray and ginger ale att. No other needs voiced.

## 2020-10-19 NOTE — ED Notes (Signed)
IVC pending consult   
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
## 2020-10-19 NOTE — ED Notes (Signed)
Patient in hallway asking to go smoke, explained hospital to policy to patient.  Patient requesting pull up.  Patient assisted in changing pull up to a depends and back to room.

## 2020-10-19 NOTE — BH Assessment (Signed)
Writer called and left a HIPPA Compliant message with daughter Jeannene Patella B.-), requesting a return phone call.
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
## 2020-10-19 NOTE — ED Notes (Signed)
TTS and psych NP in with patient. 

## 2020-10-20 ENCOUNTER — Emergency Department: Payer: Medicare Other

## 2020-10-20 DIAGNOSIS — R4689 Other symptoms and signs involving appearance and behavior: Secondary | ICD-10-CM | POA: Diagnosis not present

## 2020-10-20 NOTE — BH Assessment (Signed)
Daughter requesting to have the patient's brain scanned to determine if she have dementia. Writer asked if her PCP suspected why he didn't have it done prior to now? Writer also shared, he would ask but there is no guarantee it would be done.  The daughter became upset and stated, "he aint' worth a damn and if yall aint' going to do it, I might as well come get her." Writer followed up and asked if she doesn't feel the ER is helping her, are you saying you want to come get her? She stated she wanted the scan but she will come and get her. Writer updated ER MD.

## 2020-10-20 NOTE — ED Notes (Signed)
Resting quietly, no acute distress noted. 

## 2020-10-20 NOTE — ED Notes (Signed)
Patient refused shower patient state's NO I'm going home!
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
## 2020-10-20 NOTE — ED Provider Notes (Addendum)
Emergency Medicine Observation Re-evaluation Note  Jasmine Hays is a 87 y.o. female, seen on rounds today.  Pt initially presented to the ED for complaints of IVC Currently, the patient is resting, voices no medical complaint.  Physical Exam  BP (!) 142/78 (BP Location: Right Arm)   Pulse 79   Temp 98.3 F (36.8 C) (Oral)   Resp 20   Ht 5\' 6"  (1.676 m)   Wt 79.8 kg   SpO2 97%   BMI 28.41 kg/m  Physical Exam General: Resting in no acute distress Cardiac: No cyanosis Lungs: Equal rise and fall Psych: Not agitated  ED Course / MDM  EKG:   I have reviewed the labs performed to date as well as medications administered while in observation.  Recent changes in the last 24 hours include no events overnight.  Plan  Current plan is for psychiatric reassessment this morning. Patient is under full IVC at this time.   Paulette Blanch, MD 10/20/20 0456  ----------------------------------------- 6:23 AM on 10/20/2020 -----------------------------------------  Spoke with TTS Kerry Dory who has spoken with patient's daughter several times overnight.  Daughter is insistent on obtaining some sort of imaging study of the patient's head to evaluate for progression of dementia.  Despite explaining to daughter that dementia is more a clinical diagnosis, daughter insist on imaging study.  Will order CT head to be done when patient is awake.  Daughter has now decided not to pursue psychiatric evaluation.  However, patient is under IVC and will require psychiatry to rescind this morning.    Paulette Blanch, MD 10/20/20 [phone]

## 2020-10-20 NOTE — ED Notes (Signed)
Patient sleeping, vital signs deferred.
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
## 2020-10-20 NOTE — ED Notes (Addendum)
Pt requesting to speak to her daughter, Mikle Bosworth. Attempted to call daughter at this time. Both numbers on file went to voicemail. Left a HIPAA compliant voicemail at this time.
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
## 2021-01-13 ENCOUNTER — Ambulatory Visit: Payer: Medicare Other | Admitting: Internal Medicine

## 2021-01-28 ENCOUNTER — Ambulatory Visit (INDEPENDENT_AMBULATORY_CARE_PROVIDER_SITE_OTHER): Payer: Medicare Other | Admitting: Internal Medicine

## 2021-01-28 ENCOUNTER — Encounter: Payer: Self-pay | Admitting: Internal Medicine

## 2021-01-28 ENCOUNTER — Other Ambulatory Visit: Payer: Self-pay

## 2021-01-28 VITALS — BP 128/72 | HR 68 | Ht 66.0 in | Wt 167.3 lb

## 2021-01-28 DIAGNOSIS — F03A0 Unspecified dementia, mild, without behavioral disturbance, psychotic disturbance, mood disturbance, and anxiety: Secondary | ICD-10-CM

## 2021-01-28 DIAGNOSIS — I872 Venous insufficiency (chronic) (peripheral): Secondary | ICD-10-CM

## 2021-01-28 DIAGNOSIS — L03115 Cellulitis of right lower limb: Secondary | ICD-10-CM | POA: Diagnosis not present

## 2021-01-28 DIAGNOSIS — I83019 Varicose veins of right lower extremity with ulcer of unspecified site: Secondary | ICD-10-CM

## 2021-01-28 DIAGNOSIS — R413 Other amnesia: Secondary | ICD-10-CM | POA: Diagnosis not present

## 2021-01-28 DIAGNOSIS — G8929 Other chronic pain: Secondary | ICD-10-CM | POA: Diagnosis not present

## 2021-01-28 DIAGNOSIS — H9192 Unspecified hearing loss, left ear: Secondary | ICD-10-CM | POA: Diagnosis not present

## 2021-01-28 DIAGNOSIS — F0390 Unspecified dementia without behavioral disturbance: Secondary | ICD-10-CM

## 2021-01-28 DIAGNOSIS — L97919 Non-pressure chronic ulcer of unspecified part of right lower leg with unspecified severity: Secondary | ICD-10-CM

## 2021-01-28 DIAGNOSIS — M5442 Lumbago with sciatica, left side: Secondary | ICD-10-CM

## 2021-01-28 DIAGNOSIS — R4689 Other symptoms and signs involving appearance and behavior: Secondary | ICD-10-CM

## 2021-01-28 MED ORDER — CLONAZEPAM 0.5 MG PO TABS: 0.5000 mg | ORAL_TABLET | Freq: Every day | ORAL | 1 refills | Status: DC

## 2021-01-28 MED ORDER — CEPHALEXIN 500 MG PO CAPS: 500.0000 mg | ORAL_CAPSULE | Freq: Four times a day (QID) | ORAL | 0 refills | Status: DC

## 2021-01-28 NOTE — Assessment & Plan Note (Signed)
-   Patient's back pain is under control with medication.  - Encouraged the patient to stretch or do yoga as able to help with back pain 

## 2021-01-28 NOTE — Assessment & Plan Note (Signed)
Due to dementia 

## 2021-01-28 NOTE — Assessment & Plan Note (Signed)
Use Ace bandage in the both legs

## 2021-01-28 NOTE — Assessment & Plan Note (Signed)
Patient has a venous ulcer Of the right leg.  Patient was started on Keflex

## 2021-01-28 NOTE — Assessment & Plan Note (Signed)
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  Her memory is not that good

## 2021-01-28 NOTE — Progress Notes (Signed)
Established Patient Office Visit  Subjective:  Patient ID: Jasmine Hays, female    DOB: Mar 02, 1930  Age: 87 y.o. MRN: [id number]  CC:  Chief Complaint  Patient presents with   Follow-up   Leg Pain    Patient having bilateral leg pain     Leg Pain    Jasmine Hays presents for ulcer rt leg  Past Medical History:  Diagnosis Date   Breast cancer [facility])     Past Surgical History:  Procedure Laterality Date   ABDOMINAL HYSTERECTOMY     BREAST SURGERY     lumpectomy    History reviewed. No pertinent family history.  Social History   Socioeconomic History   Marital status: Widowed    Spouse name: Not on file   Number of children: Not on file   Years of education: Not on file   Highest education level: Not on file  Occupational History   Not on file  Tobacco Use   Smoking status: Every Day    Packs/day: 1.50    Types: Cigarettes   Smokeless tobacco: Never  Substance and Sexual Activity   Alcohol use: No   Drug use: No   Sexual activity: Not on file  Other Topics Concern   Not on file  Social History Narrative   Not on file   Social Determinants of Health   Financial Resource Strain: Not on file  Food Insecurity: Not on file  Transportation Needs: Not on file  Physical Activity: Not on file  Stress: Not on file  Social Connections: Not on file  Intimate Partner Violence: Not on file     Current Outpatient Medications:    cephALEXin (KEFLEX) 500 MG capsule, Take 1 capsule (500 mg total) by mouth 4 (four) times daily., Disp: 28 capsule, Rfl: 0   clonazePAM (KLONOPIN) 0.5 MG tablet, Take 1 tablet (0.5 mg total) by mouth daily., Disp: 30 tablet, Rfl: 1   No Known Allergies  ROS Review of Systems  Constitutional: Negative.   HENT: Negative.    Eyes: Negative.   Respiratory: Negative.    Cardiovascular: Negative.   Gastrointestinal: Negative.   Endocrine: Negative.   Genitourinary: Negative.   Musculoskeletal: Negative.   Skin: Negative.    Allergic/Immunologic: Negative.   Neurological: Negative.   Hematological: Negative.   Psychiatric/Behavioral: Negative.    All other systems reviewed and are negative.    Objective:    Physical Exam Vitals reviewed.  Constitutional:      Appearance: Normal appearance.  HENT:     Mouth/Throat:     Mouth: Mucous membranes are moist.  Eyes:     Pupils: Pupils are equal, round, and reactive to light.  Neck:     Vascular: No carotid bruit.  Cardiovascular:     Rate and Rhythm: Normal rate and regular rhythm.     Pulses: Normal pulses.     Heart sounds: Normal heart sounds.  Pulmonary:     Effort: Pulmonary effort is normal.     Breath sounds: Normal breath sounds.  Abdominal:     General: Bowel sounds are normal.     Palpations: Abdomen is soft. There is no hepatomegaly, splenomegaly or mass.     Tenderness: There is no abdominal tenderness.     Hernia: No hernia is present.  Musculoskeletal:        General: No tenderness.     Cervical back: Neck supple.     Right lower leg: No edema.  Left lower leg: No edema.       Legs:     Comments:  Right leg ulcer on the medial side varicose veins in the left leg.  Skin:    Findings: No rash.  Neurological:     Mental Status: She is alert and oriented to person, place, and time.     Motor: No weakness.  Psychiatric:        Mood and Affect: Mood and affect normal.        Behavior: Behavior normal.    BP 128/72   Pulse 68   Ht '5\' 6"'$  (1.676 m)   Wt 167 lb 4.8 oz (75.9 kg)   BMI 27.00 kg/m  Wt Readings from Last 3 Encounters:  01/28/21 167 lb 4.8 oz (75.9 kg)  10/19/20 176 lb (79.8 kg)  06/12/20 176 lb 6.4 oz (80 kg)     Health Maintenance Due  Topic Date Due   TETANUS/TDAP  Never done   Zoster Vaccines- Shingrix (1 of 2) Never done   DEXA SCAN  Never done   COVID-19 Vaccine (4 - Booster for Pfizer series) 08/18/2020   INFLUENZA VACCINE  01/26/2021    There are no preventive care reminders to display for  this patient.  Lab Results  Component Value Date   TSH 2.24 02/13/2014   Lab Results  Component Value Date   WBC 8.2 10/19/2020   HGB 14.6 10/19/2020   HCT 43.8 10/19/2020   MCV 88.1 10/19/2020   PLT 258 10/19/2020   Lab Results  Component Value Date   NA 139 10/19/2020   K 3.8 10/19/2020   CO2 23 10/19/2020   GLUCOSE 97 10/19/2020   BUN 20 10/19/2020   CREATININE 1.19 (H) 10/19/2020   BILITOT 0.5 10/19/2020   ALKPHOS 90 10/19/2020   AST 16 10/19/2020   ALT 11 10/19/2020   PROT 7.4 10/19/2020   ALBUMIN 4.0 10/19/2020   CALCIUM 9.6 10/19/2020   ANIONGAP 9 10/19/2020   No results found for: CHOL No results found for: HDL No results found for: LDLCALC No results found for: TRIG No results found for: CHOLHDL No results found for: HGBA1C    Assessment & Plan:   Problem List Items Addressed This Visit       Cardiovascular and Mediastinum   Chronic venous insufficiency - Primary    Use Ace bandage in the both legs         Nervous and Auditory   Hearing loss of left ear    Refer to ENT if it gets worse       Chronic left-sided low back pain with left-sided sciatica    - Patient's back pain is under control with medication.  - Encouraged the patient to stretch or do yoga as able to help with back pain       Relevant Medications   clonazePAM (KLONOPIN) 0.5 MG tablet   Mild dementia ([place])    Patient has paranoid dementia, sometimes he becomes aggressive and combative.  Her memory is not that good        Relevant Medications   clonazePAM (KLONOPIN) 0.5 MG tablet     Musculoskeletal and Integument   Venous ulcer of right leg ([place])    Patient has a venous ulcer Of the right leg.  Patient was started on Keflex         Other   Memory loss    Due to dementia       Aggressive behavior  Patient goes into the emotional fits intermittently       Other Visit Diagnoses     Cellulitis of right lower extremity       Relevant Medications    cephALEXin (KEFLEX) 500 MG capsule       Meds ordered this encounter  Medications   cephALEXin (KEFLEX) 500 MG capsule    Sig: Take 1 capsule (500 mg total) by mouth 4 (four) times daily.    Dispense:  28 capsule    Refill:  0   clonazePAM (KLONOPIN) 0.5 MG tablet    Sig: Take 1 tablet (0.5 mg total) by mouth daily.    Dispense:  30 tablet    Refill:  1    Follow-up: No follow-ups on file.    [name], MD

## 2021-01-28 NOTE — Assessment & Plan Note (Signed)
Patient goes into the emotional fits intermittently

## 2021-01-28 NOTE — Assessment & Plan Note (Signed)
Refer to ENT if it gets worse

## 2021-02-04 ENCOUNTER — Ambulatory Visit: Payer: Medicare Other | Admitting: Internal Medicine

## 2021-02-24 ENCOUNTER — Other Ambulatory Visit: Payer: Self-pay

## 2021-02-24 DIAGNOSIS — L03115 Cellulitis of right lower limb: Secondary | ICD-10-CM

## 2021-02-24 MED ORDER — CEPHALEXIN 500 MG PO CAPS: 500.0000 mg | ORAL_CAPSULE | Freq: Four times a day (QID) | ORAL | 0 refills | Status: DC

## 2021-06-10 ENCOUNTER — Ambulatory Visit: Payer: Medicare Other

## 2021-11-08 ENCOUNTER — Emergency Department: Payer: Medicare Other

## 2021-11-08 ENCOUNTER — Encounter: Payer: Self-pay | Admitting: Emergency Medicine

## 2021-11-08 ENCOUNTER — Emergency Department
Admission: EM | Admit: 2021-11-08 | Discharge: 2021-11-08 | Disposition: A | Discharge status: Home / Self Care | Payer: Medicare Other | Attending: Emergency Medicine | Admitting: Emergency Medicine

## 2021-11-08 DIAGNOSIS — R52 Pain, unspecified: Secondary | ICD-10-CM | POA: Diagnosis not present

## 2021-11-08 DIAGNOSIS — R0781 Pleurodynia: Secondary | ICD-10-CM | POA: Insufficient documentation

## 2021-11-08 DIAGNOSIS — I10 Essential (primary) hypertension: Secondary | ICD-10-CM | POA: Diagnosis not present

## 2021-11-08 DIAGNOSIS — Z5321 Procedure and treatment not carried out due to patient leaving prior to being seen by health care provider: Secondary | ICD-10-CM | POA: Diagnosis not present

## 2021-11-08 LAB — COMPREHENSIVE METABOLIC PANEL
ALT: 10 U/L (ref 0–44)
AST: 14 U/L — ABNORMAL LOW (ref 15–41)
Albumin: 3.7 g/dL (ref 3.5–5.0)
Alkaline Phosphatase: 85 U/L (ref 38–126)
Anion gap: 7 (ref 5–15)
BUN: 24 mg/dL — ABNORMAL HIGH (ref 8–23)
CO2: 30 mmol/L (ref 22–32)
Calcium: 9.6 mg/dL (ref 8.9–10.3)
Chloride: 106 mmol/L (ref 98–111)
Creatinine, Ser: 1.32 mg/dL — ABNORMAL HIGH (ref 0.44–1.00)
GFR, Estimated: 38 mL/min — ABNORMAL LOW (ref >60)
Glucose, Bld: 100 mg/dL — ABNORMAL HIGH (ref 70–99)
Potassium: 3.9 mmol/L (ref 3.5–5.1)
Sodium: 143 mmol/L (ref 135–145)
Total Bilirubin: 0.3 mg/dL (ref 0.3–1.2)
Total Protein: 7.2 g/dL (ref 6.5–8.1)

## 2021-11-08 LAB — CBC WITH DIFFERENTIAL/PLATELET
Abs Immature Granulocytes: 0.02 10*3/uL (ref 0.00–0.07)
Basophils Absolute: 0.1 10*3/uL (ref 0.0–0.1)
Basophils Relative: 1 %
Eosinophils Absolute: 0.1 10*3/uL (ref 0.0–0.5)
Eosinophils Relative: 2 %
HCT: 44.9 % (ref 36.0–46.0)
Hemoglobin: 14.3 g/dL (ref 12.0–15.0)
Immature Granulocytes: 0 %
Lymphocytes Relative: 21 %
Lymphs Abs: 1.3 10*3/uL (ref 0.7–4.0)
MCH: 29.4 pg (ref 26.0–34.0)
MCHC: 31.8 g/dL (ref 30.0–36.0)
MCV: 92.2 fL (ref 80.0–100.0)
Monocytes Absolute: 0.5 10*3/uL (ref 0.1–1.0)
Monocytes Relative: 8 %
Neutro Abs: 4.4 10*3/uL (ref 1.7–7.7)
Neutrophils Relative %: 68 %
Platelets: 227 10*3/uL (ref 150–400)
RBC: 4.87 MIL/uL (ref 3.87–5.11)
RDW: 13.2 % (ref 11.5–15.5)
WBC: 6.5 10*3/uL (ref 4.0–10.5)
nRBC: 0 % (ref 0.0–0.2)

## 2021-11-08 NOTE — ED Notes (Addendum)
Pt family member to desk multiple times inquiring about wait time and results of imaging. Advised pt unable to provide wait times, pt daughter states that she will take pt home. Encouraged pt and daughter to return if any change in condition.  ?
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
## 2022-01-30 ENCOUNTER — Observation Stay
Admission: EM | Admit: 2022-01-30 | Discharge: 2022-01-31 | Disposition: A | Discharge status: Hospice | Payer: Medicare Other | Attending: Internal Medicine | Admitting: Internal Medicine

## 2022-01-30 ENCOUNTER — Emergency Department: Payer: Medicare Other

## 2022-01-30 ENCOUNTER — Encounter: Payer: Self-pay | Admitting: Emergency Medicine

## 2022-01-30 ENCOUNTER — Other Ambulatory Visit: Payer: Self-pay

## 2022-01-30 DIAGNOSIS — M50322 Other cervical disc degeneration at C5-C6 level: Secondary | ICD-10-CM | POA: Diagnosis not present

## 2022-01-30 DIAGNOSIS — M4312 Spondylolisthesis, cervical region: Secondary | ICD-10-CM | POA: Diagnosis not present

## 2022-01-30 DIAGNOSIS — E876 Hypokalemia: Secondary | ICD-10-CM

## 2022-01-30 DIAGNOSIS — R160 Hepatomegaly, not elsewhere classified: Secondary | ICD-10-CM | POA: Diagnosis not present

## 2022-01-30 DIAGNOSIS — F17210 Nicotine dependence, cigarettes, uncomplicated: Secondary | ICD-10-CM | POA: Diagnosis not present

## 2022-01-30 DIAGNOSIS — S199XXA Unspecified injury of neck, initial encounter: Secondary | ICD-10-CM | POA: Diagnosis not present

## 2022-01-30 DIAGNOSIS — J439 Emphysema, unspecified: Secondary | ICD-10-CM | POA: Diagnosis not present

## 2022-01-30 DIAGNOSIS — J449 Chronic obstructive pulmonary disease, unspecified: Secondary | ICD-10-CM | POA: Insufficient documentation

## 2022-01-30 DIAGNOSIS — L97909 Non-pressure chronic ulcer of unspecified part of unspecified lower leg with unspecified severity: Secondary | ICD-10-CM | POA: Insufficient documentation

## 2022-01-30 DIAGNOSIS — R296 Repeated falls: Secondary | ICD-10-CM

## 2022-01-30 DIAGNOSIS — R638 Other symptoms and signs concerning food and fluid intake: Secondary | ICD-10-CM

## 2022-01-30 DIAGNOSIS — Z853 Personal history of malignant neoplasm of breast: Secondary | ICD-10-CM | POA: Insufficient documentation

## 2022-01-30 DIAGNOSIS — C801 Malignant (primary) neoplasm, unspecified: Secondary | ICD-10-CM | POA: Diagnosis not present

## 2022-01-30 DIAGNOSIS — R531 Weakness: Secondary | ICD-10-CM | POA: Diagnosis not present

## 2022-01-30 DIAGNOSIS — R17 Unspecified jaundice: Secondary | ICD-10-CM | POA: Diagnosis not present

## 2022-01-30 DIAGNOSIS — F17200 Nicotine dependence, unspecified, uncomplicated: Secondary | ICD-10-CM | POA: Diagnosis present

## 2022-01-30 DIAGNOSIS — W19XXXA Unspecified fall, initial encounter: Secondary | ICD-10-CM

## 2022-01-30 DIAGNOSIS — F0390 Unspecified dementia without behavioral disturbance: Secondary | ICD-10-CM | POA: Diagnosis not present

## 2022-01-30 DIAGNOSIS — M6281 Muscle weakness (generalized): Secondary | ICD-10-CM | POA: Diagnosis not present

## 2022-01-30 DIAGNOSIS — N281 Cyst of kidney, acquired: Secondary | ICD-10-CM | POA: Diagnosis not present

## 2022-01-30 DIAGNOSIS — N261 Atrophy of kidney (terminal): Secondary | ICD-10-CM | POA: Diagnosis not present

## 2022-01-30 DIAGNOSIS — I872 Venous insufficiency (chronic) (peripheral): Secondary | ICD-10-CM | POA: Diagnosis present

## 2022-01-30 DIAGNOSIS — J4489 Other specified chronic obstructive pulmonary disease: Secondary | ICD-10-CM

## 2022-01-30 DIAGNOSIS — K831 Obstruction of bile duct: Principal | ICD-10-CM | POA: Insufficient documentation

## 2022-01-30 LAB — CBC WITH DIFFERENTIAL/PLATELET
Abs Immature Granulocytes: 0.04 10*3/uL (ref 0.00–0.07)
Basophils Absolute: 0.1 10*3/uL (ref 0.0–0.1)
Basophils Relative: 1 %
Eosinophils Absolute: 0.1 10*3/uL (ref 0.0–0.5)
Eosinophils Relative: 1 %
HCT: 39 % (ref 36.0–46.0)
Hemoglobin: 13 g/dL (ref 12.0–15.0)
Immature Granulocytes: 1 %
Lymphocytes Relative: 13 %
Lymphs Abs: 0.9 10*3/uL (ref 0.7–4.0)
MCH: 28.8 pg (ref 26.0–34.0)
MCHC: 33.3 g/dL (ref 30.0–36.0)
MCV: 86.3 fL (ref 80.0–100.0)
Monocytes Absolute: 0.6 10*3/uL (ref 0.1–1.0)
Monocytes Relative: 8 %
Neutro Abs: 5.1 10*3/uL (ref 1.7–7.7)
Neutrophils Relative %: 76 %
Platelets: 261 10*3/uL (ref 150–400)
RBC: 4.52 MIL/uL (ref 3.87–5.11)
RDW: 19.9 % — ABNORMAL HIGH (ref 11.5–15.5)
WBC: 6.7 10*3/uL (ref 4.0–10.5)
nRBC: 0 % (ref 0.0–0.2)

## 2022-01-30 LAB — COMPREHENSIVE METABOLIC PANEL
ALT: 106 U/L — ABNORMAL HIGH (ref 0–44)
AST: 102 U/L — ABNORMAL HIGH (ref 15–41)
Albumin: 2.7 g/dL — ABNORMAL LOW (ref 3.5–5.0)
Alkaline Phosphatase: 298 U/L — ABNORMAL HIGH (ref 38–126)
Anion gap: 10 (ref 5–15)
BUN: 21 mg/dL (ref 8–23)
CO2: 25 mmol/L (ref 22–32)
Calcium: 8.8 mg/dL — ABNORMAL LOW (ref 8.9–10.3)
Chloride: 103 mmol/L (ref 98–111)
Creatinine, Ser: 1.06 mg/dL — ABNORMAL HIGH (ref 0.44–1.00)
GFR, Estimated: 50 mL/min — ABNORMAL LOW (ref >60)
Glucose, Bld: 102 mg/dL — ABNORMAL HIGH (ref 70–99)
Potassium: 2.6 mmol/L — CL (ref 3.5–5.1)
Sodium: 138 mmol/L (ref 135–145)
Total Bilirubin: 17.2 mg/dL — ABNORMAL HIGH (ref 0.3–1.2)
Total Protein: 6.3 g/dL — ABNORMAL LOW (ref 6.5–8.1)

## 2022-01-30 LAB — TROPONIN I (HIGH SENSITIVITY)
Troponin I (High Sensitivity): 12 ng/L (ref <18)
Troponin I (High Sensitivity): 12 ng/L (ref <18)

## 2022-01-30 LAB — URINALYSIS, ROUTINE W REFLEX MICROSCOPIC
Glucose, UA: NEGATIVE mg/dL
Hgb urine dipstick: NEGATIVE
Ketones, ur: NEGATIVE mg/dL
Leukocytes,Ua: NEGATIVE
Nitrite: NEGATIVE
Protein, ur: NEGATIVE mg/dL
Specific Gravity, Urine: 1.016 (ref 1.005–1.030)
pH: 5 (ref 5.0–8.0)

## 2022-01-30 LAB — CK: Total CK: 19 U/L — ABNORMAL LOW (ref 38–234)

## 2022-01-30 LAB — LIPASE, BLOOD: Lipase: 68 U/L — ABNORMAL HIGH (ref 11–51)

## 2022-01-30 MED ORDER — DEXTROSE IN LACTATED RINGERS 5 % IV SOLN: INTRAVENOUS | Status: AC

## 2022-01-30 MED ORDER — ENOXAPARIN SODIUM 40 MG/0.4ML IJ SOSY
40.0000 mg | PREFILLED_SYRINGE | INTRAMUSCULAR | Status: DC
  Administered 2022-01-31: 40 mg via SUBCUTANEOUS
  Filled 2022-01-30: qty 0.4

## 2022-01-30 MED ORDER — ACETAMINOPHEN 325 MG PO TABS: 650.0000 mg | ORAL_TABLET | Freq: Four times a day (QID) | ORAL | Status: DC | PRN

## 2022-01-30 MED ORDER — POTASSIUM CHLORIDE CRYS ER 20 MEQ PO TBCR
40.0000 meq | EXTENDED_RELEASE_TABLET | Freq: Once | ORAL | Status: AC
  Administered 2022-01-30: 40 meq via ORAL
  Filled 2022-01-30: qty 2

## 2022-01-30 MED ORDER — IOHEXOL 300 MG/ML  SOLN
75.0000 mL | Freq: Once | INTRAMUSCULAR | Status: AC | PRN
  Administered 2022-01-30: 75 mL via INTRAVENOUS

## 2022-01-30 MED ORDER — ACETAMINOPHEN 650 MG RE SUPP: 650.0000 mg | Freq: Four times a day (QID) | RECTAL | Status: DC | PRN

## 2022-01-30 MED ORDER — NICOTINE 21 MG/24HR TD PT24
21.0000 mg | MEDICATED_PATCH | Freq: Every day | TRANSDERMAL | Status: DC
  Filled 2022-01-30 (×2): qty 1

## 2022-01-30 MED ORDER — ONDANSETRON HCL 4 MG PO TABS: 4.0000 mg | ORAL_TABLET | Freq: Four times a day (QID) | ORAL | Status: DC | PRN

## 2022-01-30 MED ORDER — CLONAZEPAM 0.5 MG PO TABS
0.5000 mg | ORAL_TABLET | Freq: Every day | ORAL | Status: DC
  Administered 2022-01-31: 0.5 mg via ORAL
  Filled 2022-01-30: qty 1

## 2022-01-30 MED ORDER — ONDANSETRON HCL 4 MG/2ML IJ SOLN: 4.0000 mg | Freq: Four times a day (QID) | INTRAMUSCULAR | Status: DC | PRN

## 2022-01-30 MED ORDER — SODIUM CHLORIDE 0.9 % IV SOLN: Freq: Once | INTRAVENOUS | Status: AC

## 2022-01-30 MED ORDER — NICOTINE 21 MG/24HR TD PT24
21.0000 mg | MEDICATED_PATCH | Freq: Once | TRANSDERMAL | Status: DC
  Administered 2022-01-30: 21 mg via TRANSDERMAL
  Filled 2022-01-30: qty 1

## 2022-01-30 NOTE — ED Notes (Signed)
Pt transported to CT ?

## 2022-01-30 NOTE — Assessment & Plan Note (Signed)
IV repletion and serial monitoring

## 2022-01-30 NOTE — Assessment & Plan Note (Signed)
DuoNebs as needed 

## 2022-01-30 NOTE — ED Provider Notes (Signed)
[facility] Provider Note    Event Date/Time   First MD Initiated Contact with Patient 01/30/22 1912     (approximate)   History   Weakness   HPI  Jasmine Hays is a 88 y.o. female who presents to the emergency department today accompanied by family because of concerns for weakness and jaundice.  Family states that over the past couple of weeks they have noticed that the patient has had increasing weakness.  Has had a couple falls during this time. Urine has been very dar.  Today when granddaughter went over to visit noticed that patient was jaundiced.  Patient denies any abdominal pain. No fevers.   Physical Exam   Triage Vital Signs: ED Triage Vitals  Enc Vitals Group     BP 01/30/22 1719 (!) 148/89     Pulse Rate 01/30/22 1719 86     Resp 01/30/22 1719 18     Temp 01/30/22 1719 (!) 97.5 F (36.4 C)     Temp Source 01/30/22 1719 Oral     SpO2 01/30/22 1719 98 %     Weight 01/30/22 1720 150 lb (68 kg)     Height 01/30/22 1720 '5\' 6"'$  (1.676 m)     Head Circumference --      Peak Flow --      Pain Score 01/30/22 1720 0     Pain Loc --      Pain Edu? --      Excl. in [place]? --     Most recent vital signs: Vitals:   01/30/22 1719  BP: (!) 148/89  Pulse: 86  Resp: 18  Temp: (!) 97.5 F (36.4 C)  SpO2: 98%    General: Awake, alert, oriented. CV:  Good peripheral perfusion. Regular rate and rhythm. Resp:  Normal effort. Lungs clear. Abd:  No distention. Non tender. Other:  Jaundice.    ED Results / Procedures / Treatments   Labs (all labs ordered are listed, but only abnormal results are displayed) Labs Reviewed  COMPREHENSIVE METABOLIC PANEL - Abnormal; Notable for the following components:      Result Value   Potassium 2.6 (*)    Glucose, Bld 102 (*)    Creatinine, Ser 1.06 (*)    Calcium 8.8 (*)    Total Protein 6.3 (*)    Albumin 2.7 (*)    AST 102 (*)    ALT 106 (*)    Alkaline Phosphatase 298 (*)    Total Bilirubin 17.2  (*)    GFR, Estimated 50 (*)    All other components within normal limits  LIPASE, BLOOD - Abnormal; Notable for the following components:   Lipase 68 (*)    All other components within normal limits  CBC WITH DIFFERENTIAL/PLATELET - Abnormal; Notable for the following components:   RDW 19.9 (*)    All other components within normal limits  CK - Abnormal; Notable for the following components:   Total CK 19 (*)    All other components within normal limits  URINALYSIS, ROUTINE W REFLEX MICROSCOPIC  TROPONIN I (HIGH SENSITIVITY)  TROPONIN I (HIGH SENSITIVITY)     EKG  I, Nance Pear, attending physician, personally viewed and interpreted this EKG  EKG Time: 1721 Rate: 83 Rhythm: normal sinus rhythm Axis: normal Intervals: qtc 432 QRS: narrow, q waves III, aVF ST changes: no st elevation Impression: abnormal ekg   RADIOLOGY I independently interpreted and visualized the CT head. My interpretation: No bleed Radiology interpretation:  IMPRESSION:  1. No acute intracranial abnormality. No skull fracture.  2. Stable atrophy, chronic small vessel ischemic change, and left  frontal encephalomalacia.    I independently interpreted and visualized the CT cervical spine. My interpretation: No acute osseous abnormality Radiology interpretation:  IMPRESSION:  1. No acute fracture or traumatic subluxation of the cervical spine.  2. Multilevel degenerative disc disease and facet hypertrophy.   I independently interpreted and visualized the CT abd/pel. My interpretation: Large mass medial to liver Radiology interpretation:  IMPRESSION:  1. 7.5 cm mass centered within the caudate lobe of the liver  demonstrating mass effect upon the adjacent portal vein and  encasement of the central hepatic arteries and biliary confluence  with resultant moderate intrahepatic biliary ductal dilation.  Differential considerations include a primary hepatic mass such as  hepatocellular carcinoma  versus metastatic disease. Correlation with  serum AFP and CA 19 9 levels may be helpful for further evaluation.  Contrast enhanced MRI examination is also recommended for further  evaluation.  2. Cholelithiasis.  3. Severe sigmoid diverticulosis without superimposed acute  inflammatory change.  4. Stable left adrenal nodule, likely benign given its stability  over time. This can be definitively assessed simultaneously with MRI  examination of the liver.  5. Moderate bilateral renal cortical atrophy, right greater than  left.     PROCEDURES:  Critical Care performed: No  Procedures   MEDICATIONS ORDERED IN ED: Medications - No data to display   IMPRESSION / MDM / [place] / ED COURSE  I reviewed the triage vital signs and the nursing notes.                              Differential diagnosis includes, but is not limited to, liver dysfunction, liver outlet obstruction.  Patient's presentation is most consistent with acute presentation with potential threat to life or bodily function.  Patient presented to the emergency department today because of concerns for increased weakness as well as jaundice.  On exam patient does have a significant jaundice.  Blood work shows elevated bilirubin.  CT abdomen pelvis was obtained which showed a large liver mass.  I did have a extensive discussion with family about this finding.  Discussed concern for cancer.  At this time family and patient understands potential devastating prognosis.  They stated they would not want any aggressive treatment.  Additionally we discussed ERCP.  This point they did not feel they would want ERCP given necessity for anesthesia and patient's baseline medical conditions.  Daughter did state that they would like to be able to take her home under hospice care.  At this time will plan on admission to the hospitalist service for further work-up and management.     FINAL CLINICAL IMPRESSION(S) / ED DIAGNOSES    Final diagnoses:  Weakness  Hypokalemia  Liver mass      Note:  This document was prepared using Dragon voice recognition software and may include unintentional dictation errors.    Nance Pear, MD 01/30/22 [phone]

## 2022-01-30 NOTE — Assessment & Plan Note (Deleted)
Venous stasis ulcer Keep legs elevated.  Wound care consult

## 2022-01-30 NOTE — H&P (Signed)
History and Physical    Patient: Jasmine Hays SYJ:[id number] DOB: December 26, 1929 DOA: 01/30/2022 DOS: the patient was seen and examined on 01/30/2022 PCP: Corky Downs, MD  Patient coming from: Home  Chief Complaint:  Chief Complaint  Patient presents with   Weakness    HPI: Jasmine Hays is a 88 y.o. female with medical history significant for COPD, smoker, dementia,  who was brought to the emergency room with a 1 week history of increasing weakness and falls.  She also has decreased oral intake, decreased appetite and has been losing weight.  Daughter noted her to be jaundiced and she was brought to the emergency room for evaluation. ED course and data review: Temperature 97.5 with pulse 86 respirations 18 and BP 148/89 with O2 sat 98% on room air Labs: CBC mostly WNL, CMP significant for potassium of 2.6, creatinine 1.06 which appears to be baseline but more notably elevation in LFTs with AST 102, ALT 106 alk phos 298 and total bilirubin 17.2.  Lipase 68, total CK19 and troponin 12.  Urinalysis unremarkable EKG, personally viewed and interpreted showed NSR at 83 with nonspecific ST-T wave changes Imaging: CT head and C-spine nonacute CT abdomen and pelvis shows the following: IMPRESSION: 1. 7.5 cm mass centered within the caudate lobe of the liver demonstrating mass effect upon the adjacent portal vein and encasement of the central hepatic arteries and biliary confluence with resultant moderate intrahepatic biliary ductal dilation. Differential considerations include a primary hepatic mass such as hepatocellular carcinoma versus metastatic disease. Correlation with serum AFP and CA 19 9 levels may be helpful for further evaluation. Contrast enhanced MRI examination is also recommended for further evaluation. 2. Cholelithiasis. 3. Severe sigmoid diverticulosis without superimposed acute inflammatory change. 4. Stable left adrenal nodule, likely benign given its stability over time.  This can be definitively assessed simultaneously with MRI examination of the liver. 5. Moderate bilateral renal cortical atrophy, right greater than left.  The ED provider spoke with family who are not interested in advanced treatment and additional diagnostic procedures but are open to consultations with oncology and palliative care.  She was started on IV potassium replacement and IV fluids.  Hospitalist consulted for admission.     Past Medical History:  Diagnosis Date   Breast cancer [facility])    Past Surgical History:  Procedure Laterality Date   ABDOMINAL HYSTERECTOMY     BREAST SURGERY     lumpectomy   Social History:  reports that she has been smoking cigarettes. She has been smoking an average of 1.5 packs per day. She has never used smokeless tobacco. She reports that she does not drink alcohol and does not use drugs.  No Known Allergies  No family history on file.  Prior to Admission medications   Medication Sig Start Date End Date Taking? Authorizing Provider  cephALEXin (KEFLEX) 500 MG capsule Take 1 capsule (500 mg total) by mouth 4 (four) times daily. 02/24/21   Corky Downs, MD  clonazePAM (KLONOPIN) 0.5 MG tablet Take 1 tablet (0.5 mg total) by mouth daily. 01/28/21   Corky Downs, MD    Physical Exam: Vitals:   01/30/22 1719 01/30/22 1720 01/30/22 1920 01/30/22 2130  BP: (!) 148/89  129/76 (!) 146/91  Pulse: 86  62 84  Resp: 18  (!) 25 (!) 28  Temp: (!) 97.5 F (36.4 C)   97.6 F (36.4 C)  TempSrc: Oral   Oral  SpO2: 98%  96% 95%  Weight:  68 kg  Height:  $Remove'5\' 6"'GZGktMS$  (1.676 m)     Physical Exam Vitals and nursing note reviewed.  Constitutional:      General: She is not in acute distress. HENT:     Head: Normocephalic and atraumatic.  Cardiovascular:     Rate and Rhythm: Normal rate and regular rhythm.     Heart sounds: Normal heart sounds.  Pulmonary:     Effort: Pulmonary effort is normal.     Breath sounds: Normal breath sounds.  Abdominal:      Palpations: Abdomen is soft.     Tenderness: There is no abdominal tenderness.  Skin:    Coloration: Skin is jaundiced.  Neurological:     Mental Status: Mental status is at baseline.     Labs on Admission: I have personally reviewed following labs and imaging studies  CBC: Recent Labs  Lab 01/30/22 1722  WBC 6.7  NEUTROABS 5.1  HGB 13.0  HCT 39.0  MCV 86.3  PLT 170   Basic Metabolic Panel: Recent Labs  Lab 01/30/22 1722  NA 138  K 2.6*  CL 103  CO2 25  GLUCOSE 102*  BUN 21  CREATININE 1.06*  CALCIUM 8.8*   GFR: Estimated Creatinine Clearance: 33 mL/min (A) (by C-G formula based on SCr of 1.06 mg/dL (H)). Liver Function Tests: Recent Labs  Lab 01/30/22 1722  AST 102*  ALT 106*  ALKPHOS 298*  BILITOT 17.2*  PROT 6.3*  ALBUMIN 2.7*   Recent Labs  Lab 01/30/22 1722  LIPASE 68*   No results for input(s): "AMMONIA" in the last 168 hours. Coagulation Profile: No results for input(s): "INR", "PROTIME" in the last 168 hours. Cardiac Enzymes: Recent Labs  Lab 01/30/22 1722  CKTOTAL 19*   BNP (last 3 results) No results for input(s): "PROBNP" in the last 8760 hours. HbA1C: No results for input(s): "HGBA1C" in the last 72 hours. CBG: No results for input(s): "GLUCAP" in the last 168 hours. Lipid Profile: No results for input(s): "CHOL", "HDL", "LDLCALC", "TRIG", "CHOLHDL", "LDLDIRECT" in the last 72 hours. Thyroid Function Tests: No results for input(s): "TSH", "T4TOTAL", "FREET4", "T3FREE", "THYROIDAB" in the last 72 hours. Anemia Panel: No results for input(s): "VITAMINB12", "FOLATE", "FERRITIN", "TIBC", "IRON", "RETICCTPCT" in the last 72 hours. Urine analysis:    Component Value Date/Time   COLORURINE AMBER (A) 01/30/2022 1921   APPEARANCEUR CLEAR (A) 01/30/2022 1921   APPEARANCEUR Clear 02/12/2014 0040   LABSPEC 1.016 01/30/2022 1921   LABSPEC 1.018 02/12/2014 0040   PHURINE 5.0 01/30/2022 1921   GLUCOSEU NEGATIVE 01/30/2022 1921    GLUCOSEU Negative 02/12/2014 0040   HGBUR NEGATIVE 01/30/2022 1921   BILIRUBINUR MODERATE (A) 01/30/2022 1921   BILIRUBINUR Negative 02/12/2014 0040   KETONESUR NEGATIVE 01/30/2022 1921   PROTEINUR NEGATIVE 01/30/2022 1921   NITRITE NEGATIVE 01/30/2022 1921   LEUKOCYTESUR NEGATIVE 01/30/2022 1921   LEUKOCYTESUR Negative 02/12/2014 0040    Radiological Exams on Admission: CT ABDOMEN PELVIS W CONTRAST  Result Date: 01/30/2022 CLINICAL DATA:  Jaundice EXAM: CT ABDOMEN AND PELVIS WITH CONTRAST TECHNIQUE: Multidetector CT imaging of the abdomen and pelvis was performed using the standard protocol following bolus administration of intravenous contrast. RADIATION DOSE REDUCTION: This exam was performed according to the departmental dose-optimization program which includes automated exposure control, adjustment of the mA and/or kV according to patient size and/or use of iterative reconstruction technique. CONTRAST:  4mL OMNIPAQUE IOHEXOL 300 MG/ML  SOLN COMPARISON:  None Available. FINDINGS: Lower chest: No acute abnormality.  Small hiatal hernia Hepatobiliary: A  lobulated hypodense enhancing mass is seen centered within the caudate lobe of the liver demonstrating mass effect upon the adjacent portal vein and encasement of the proper hepatic artery as well as the central right and left hepatic arteries. The mass obliterates the biliary confluence, best seen on image # 24-27/2. There is resultant moderate intrahepatic biliary ductal dilation. The mass measures 4.7 x 7.5 x 7.2 cm on axial image # 26/2 and coronal image # 38/5. Though there is abutment and mass effect upon the portal vein, this structure does not appear dated and remains patent. Similarly, the mass demonstrates mass effect upon the pylorus and duodenal bulb without clear invasion though there is focal obliteration of the separating fat plane best seen on coronal image # 31/5. Differential considerations include a primary hepatic mass such as  hepatocellular carcinoma versus metastatic disease. No additional intrahepatic mass identified. The gallbladder is contracted and there are numerous intraluminal calculi identified suggesting changes of chronic cholecystitis in the appropriate clinical setting. No superimposed pericholecystic acute inflammatory change identified. Pancreas: Unremarkable Spleen: Scattered low-attenuation lesions are seen within the spleen measuring up to 18 mm possibly representing small to small cysts or hemangioma. The spleen is not enlarged. No perisplenic fluid collections are seen. Adrenals/Urinary Tract: There is a nodule within the a lateral limb of the left adrenal gland which is indeterminate on this examination measuring 12 mm in thickness, however, this appears stable since prior MRI examination of 12/24/2015 and is likely benign given its stability over time. Right adrenal gland is unremarkable. Moderate bilateral renal cortical atrophy is present, right greater than left. Multiple simple cortical cysts are seen within the kidneys bilaterally. No further follow-up is recommended for these lesions. No enhancing intrarenal mass. No hydronephrosis. No intrarenal or ureteral calculi. The bladder is unremarkable. Stomach/Bowel: Severe sigmoid diverticulosis without superimposed acute inflammatory change. The stomach, small bowel, and large bowel are otherwise unremarkable. Appendix absent. No free intraperitoneal gas or fluid. Vascular/Lymphatic: Aortic atherosclerosis. No enlarged abdominal or pelvic lymph nodes. Reproductive: Status post hysterectomy. No adnexal masses. Other: No abdominal wall hernia Musculoskeletal: No acute bone abnormality. No lytic or blastic bone lesion. IMPRESSION: 1. 7.5 cm mass centered within the caudate lobe of the liver demonstrating mass effect upon the adjacent portal vein and encasement of the central hepatic arteries and biliary confluence with resultant moderate intrahepatic biliary ductal  dilation. Differential considerations include a primary hepatic mass such as hepatocellular carcinoma versus metastatic disease. Correlation with serum AFP and CA 19 9 levels may be helpful for further evaluation. Contrast enhanced MRI examination is also recommended for further evaluation. 2. Cholelithiasis. 3. Severe sigmoid diverticulosis without superimposed acute inflammatory change. 4. Stable left adrenal nodule, likely benign given its stability over time. This can be definitively assessed simultaneously with MRI examination of the liver. 5. Moderate bilateral renal cortical atrophy, right greater than left. Aortic Atherosclerosis (ICD10-I70.0). Electronically Signed   By: Fidela Salisbury M.D.   On: 01/30/2022 20:25   CT Cervical Spine Wo Contrast  Result Date: 01/30/2022 CLINICAL DATA:  Neck trauma (Age >= 65y) Fall. EXAM: CT CERVICAL SPINE WITHOUT CONTRAST TECHNIQUE: Multidetector CT imaging of the cervical spine was performed without intravenous contrast. Multiplanar CT image reconstructions were also generated. RADIATION DOSE REDUCTION: This exam was performed according to the departmental dose-optimization program which includes automated exposure control, adjustment of the mA and/or kV according to patient size and/or use of iterative reconstruction technique. COMPARISON:  None Available. FINDINGS: Alignment: Broad-based oversew of normal lordosis. No  traumatic subluxation. There is degenerative stepwise grade 1 anterolisthesis of C2 on C3, C3 on C4, and C4 on C5. Skull base and vertebrae: No acute fracture. Vertebral body heights are maintained. The dens and skull base are intact. Soft tissues and spinal canal: No prevertebral fluid or swelling. No visible canal hematoma. Disc levels: Diffuse degenerative disc disease most prominently affecting C5-C6 and C6-C7. Moderate multilevel facet hypertrophy. Upper chest: Mild apical emphysema.  No acute findings. Other: Incidental note of medial course of the  right common carotid. IMPRESSION: 1. No acute fracture or traumatic subluxation of the cervical spine. 2. Multilevel degenerative disc disease and facet hypertrophy. Electronically Signed   By: Keith Rake M.D.   On: 01/30/2022 19:17   CT Head Wo Contrast  Result Date: 01/30/2022 CLINICAL DATA:  Head trauma, minor (Age >= 65y) Generalized weakness.  Fall yesterday. EXAM: CT HEAD WITHOUT CONTRAST TECHNIQUE: Contiguous axial images were obtained from the base of the skull through the vertex without intravenous contrast. RADIATION DOSE REDUCTION: This exam was performed according to the departmental dose-optimization program which includes automated exposure control, adjustment of the mA and/or kV according to patient size and/or use of iterative reconstruction technique. COMPARISON:  Head CT 10/22/2020 FINDINGS: Brain: No acute intracranial hemorrhage. No evidence of acute ischemia. Stable degree of generalized atrophy and chronic periventricular small vessel ischemic change. Chronic left frontal encephalomalacia with volume loss, unchanged. No subdural or extra-axial collection. No midline shift or mass effect. Vascular: Atherosclerosis of skullbase vasculature without hyperdense vessel or abnormal calcification. Skull: No fracture or focal lesion. Sinuses/Orbits: Chronic opacification of inferior right frontal sinus. No mastoid effusion. Bilateral cataract resection. Other: Small right frontal scalp lipoma, unchanged. IMPRESSION: 1. No acute intracranial abnormality. No skull fracture. 2. Stable atrophy, chronic small vessel ischemic change, and left frontal encephalomalacia. Electronically Signed   By: Keith Rake M.D.   On: 01/30/2022 19:13     Data Reviewed: Relevant notes from primary care and specialist visits, past discharge summaries as available in EHR, including Care Everywhere. 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 --Differential considerations include a primary hepatic mass such as hepatocellular carcinoma versus metastatic disease. --Consider palliative care consult and oncology consult --Determination of further recommended work-up including serum AFP and CA 19 9 levels and contrast enhanced MRI pending further discussion with oncology and palliative care   Hypokalemia IV repletion and serial monitoring  Generalized weakness Inadequate oral intake Frequent falls Secondary to suspected malignancy, physical deconditioning, electrolyte abnormalities Nutritionist evaluation.  Consider PT eval IV hydration overnight  Chronic bronchitis, obstructive (HCC) DuoNebs as needed  Dementia without behavioral disturbance (HCC) Currently on Klonopin 0.5 mg twice daily Has history of behavioral disturbance with aggression as noted on review of PCP notes Delirium precautions --Get up during the day --Keep blinds open and lights on during daylight hours --Minimize the use of opioids/benzodiazepines --Reorient the patient frequently, provide easily visible clock and calendar --Provide sensory aids like glasses, hearing aids --Encourage ambulation, regular  activities and visitors to maintain cognitive stimulation  --Patient would benefit from having family members at bedside to reinforce his orientation.      DVT prophylaxis: Lovenox  Consults: none  Advance Care  Planning:   Code Status: Prior patient is full code  Family Communication: Spoke with son Sophronia Simas regarding diagnosis.  Patient is a full code for now.  CODE STATUS  Disposition Plan: Back to previous home environment  Severity of Illness: The appropriate patient status for this patient is INPATIENT. Inpatient status is judged to be reasonable and necessary in order to provide the required intensity of service to ensure the patient's safety. The patient's presenting symptoms, physical exam findings, and initial radiographic and laboratory data in the context of their chronic comorbidities is felt to place them at high risk for further clinical deterioration. Furthermore, it is not anticipated that the patient will be medically stable for discharge from the hospital within 2 midnights of admission.   * I certify that at the point of admission it is my clinical judgment that the patient will require inpatient hospital care spanning beyond 2 midnights from the point of admission due to high intensity of service, high risk for further deterioration and high frequency of surveillance required.*  Author: Athena Masse, MD 01/30/2022 10:08 PM  For on call review [email or web address].

## 2022-01-30 NOTE — Assessment & Plan Note (Signed)
Inadequate oral intake Frequent falls Secondary to suspected malignancy, physical deconditioning, electrolyte abnormalities Nutritionist evaluation.  Consider PT eval IV hydration overnight

## 2022-01-30 NOTE — Assessment & Plan Note (Addendum)
--  CT abdomen showing 7.5 cm mass centered within the caudate lobe of the liver demonstrating mass effect upon the adjacent portal vein and encasement of the central hepatic arteries and biliary confluence with resultant moderate intrahepatic biliary ductal dilation. --Differential considerations include a primary hepatic mass such as hepatocellular carcinoma versus metastatic disease. --Consider palliative care consult and oncology consult --Determination of further recommended work-up including serum AFP and CA 19 9 levels and contrast enhanced MRI pending further discussion with oncology and palliative care

## 2022-01-30 NOTE — Assessment & Plan Note (Signed)
Currently on Klonopin 0.5 mg twice daily Has history of behavioral disturbance with aggression as noted on review of PCP notes Delirium precautions --Get up during the day --Keep blinds open and lights on during daylight hours --Minimize the use of opioids/benzodiazepines --Reorient the patient frequently, provide easily visible clock and calendar --Provide sensory aids like glasses, hearing aids --Encourage ambulation, regular activities and visitors to maintain cognitive stimulation  --Patient would benefit from having family members at bedside to reinforce his orientation.

## 2022-01-30 NOTE — ED Triage Notes (Addendum)
Patient to ED via POV with daughter for generalized weakness. Patient's daughter states she has not been drinking/eating much with dark urine. Patient states she just not hungry. Daughter also states yellowish colored skin. Daughter states she fell over yesterday while trying to put dog bowls down. Also, 7lb weight loss int he past week and a half.

## 2022-01-30 NOTE — ED Provider Triage Note (Signed)
Emergency Medicine Provider Triage Evaluation Note  Jasmine Hays , a 88 y.o. female  was evaluated in triage.  Pt complains of dehydration/weakness brought in by daughter. Daughter reports dark urine over the past 3 days and patient not eating/drinking. Also noticed that for the past 3 days daughter noticed yellowing of the skin. She had a fall yesterday, bending over to put the dog bowls down and fell. Hit her head. No LOC. Rod Can helped her up. Patient says shes not hungry and doesn't want to eat. No pain. Daughter reports lost 7 lbs in the past couple weeks.  Review of Systems  Positive: Fatigue/weakness, yellowing of skin, lack of appetite Negative: Abd pain, n/v/d, fever  Physical Exam  There were no vitals taken for this visit. Gen:   Awake, no distress   Resp:  Normal effort  MSK:   Moves extremities without difficulty  Other:    Medical Decision Making  Medically screening exam initiated at 5:16 PM.  Appropriate orders placed.  Jasmine Hays was informed that the remainder of the evaluation will be completed by another provider, this initial triage assessment does not replace that evaluation, and the importance of remaining in the ED until their evaluation is complete.     Marquette Old, PA-C 01/30/22 1723

## 2022-01-31 DIAGNOSIS — C801 Malignant (primary) neoplasm, unspecified: Secondary | ICD-10-CM | POA: Diagnosis not present

## 2022-01-31 DIAGNOSIS — K831 Obstruction of bile duct: Secondary | ICD-10-CM | POA: Diagnosis not present

## 2022-01-31 LAB — COMPREHENSIVE METABOLIC PANEL
ALT: 100 U/L — ABNORMAL HIGH (ref 0–44)
AST: 102 U/L — ABNORMAL HIGH (ref 15–41)
Albumin: 2.4 g/dL — ABNORMAL LOW (ref 3.5–5.0)
Alkaline Phosphatase: 289 U/L — ABNORMAL HIGH (ref 38–126)
Anion gap: 9 (ref 5–15)
BUN: 15 mg/dL (ref 8–23)
CO2: 23 mmol/L (ref 22–32)
Calcium: 8.9 mg/dL (ref 8.9–10.3)
Chloride: 106 mmol/L (ref 98–111)
Creatinine, Ser: 0.79 mg/dL (ref 0.44–1.00)
GFR, Estimated: >60 mL/min (ref >60)
Glucose, Bld: 111 mg/dL — ABNORMAL HIGH (ref 70–99)
Potassium: 2.9 mmol/L — ABNORMAL LOW (ref 3.5–5.1)
Sodium: 138 mmol/L (ref 135–145)
Total Bilirubin: 17 mg/dL — ABNORMAL HIGH (ref 0.3–1.2)
Total Protein: 5.8 g/dL — ABNORMAL LOW (ref 6.5–8.1)

## 2022-01-31 NOTE — Consult Note (Signed)
[facility] CONSULT NOTE  Patient Care Team: Jasmine Athens, MD as PCP - General (Internal Medicine)  CHIEF COMPLAINTS/PURPOSE OF CONSULTATION:  Obstructive jaundice  HISTORY OF PRESENTING ILLNESS:  Jasmine Hays 88 y.o.  female history of dementia moderate to severe; advanced COPD; history of falls is currently admitted to hospital for generalized weakness; and also yellowing of the eyes.  On admission patient noted to have obstructive jaundice with elevated LFTs including bilirubin up to 17. CT scan abdomen pelvis shows large mass approximately 7 cm involving the caudate lobe of the liver causing obstruction with biliary system.  Patient admitted to hospital for further evaluation.  Patient complains of chronic back pain noted to be getting worse for the last few weeks.   As per the family patient has lost weight.  Poor appetite.  History of falls. The [place] to do, melena.  Review of Systems  Unable to perform ROS: Dementia    MEDICAL HISTORY:  Past Medical History:  Diagnosis Date   Breast cancer ([place])     SURGICAL HISTORY: Past Surgical History:  Procedure Laterality Date   ABDOMINAL HYSTERECTOMY     BREAST SURGERY     lumpectomy    SOCIAL HISTORY: Social History   Socioeconomic History   Marital status: Widowed    Spouse name: Not on file   Number of children: Not on file   Years of education: Not on file   Highest education level: Not on file  Occupational History   Not on file  Tobacco Use   Smoking status: Every Day    Packs/day: 1.50    Types: Cigarettes   Smokeless tobacco: Never  Substance and Sexual Activity   Alcohol use: No   Drug use: No   Sexual activity: Not on file  Other Topics Concern   Not on file  Social History Narrative   Not on file   Social Determinants of Health   Financial Resource Strain: Not on file  Food Insecurity: Not on file  Transportation Needs: Not on file  Physical Activity: Not on file  Stress:  Not on file  Social Connections: Not on file  Intimate Partner Violence: Not on file    FAMILY HISTORY: No family history on file.  ALLERGIES:  has No Known Allergies.  MEDICATIONS:  No current facility-administered medications for this encounter.   No current outpatient medications on file.    PHYSICAL EXAMINATION:   Vitals:   01/31/22 0540 01/31/22 0826  BP: (!) 146/68 (!) 143/68  Pulse: 79 (!) 58  Resp: 16 16  Temp: 98.7 F (37.1 C) 97.8 F (36.6 C)  SpO2: 97% 98%   Filed Weights   01/30/22 1720 01/30/22 2317  Weight: 150 lb (68 kg) 152 lb 8.9 oz (69.2 kg)    Physical Exam Vitals and nursing note reviewed.  HENT:     Head: Normocephalic and atraumatic.     Mouth/Throat:     Pharynx: Oropharynx is clear.  Eyes:     General: Scleral icterus present.     Extraocular Movements: Extraocular movements intact.     Pupils: Pupils are equal, round, and reactive to light.  Cardiovascular:     Rate and Rhythm: Normal rate and regular rhythm.  Pulmonary:     Comments: Decreased breath sounds bilaterally.  Abdominal:     Palpations: Abdomen is soft.  Musculoskeletal:        General: Normal range of motion.     Cervical back: Normal range of  motion.  Skin:    General: Skin is warm.  Neurological:     General: No focal deficit present.     Mental Status: She is alert.  Psychiatric:        Behavior: Behavior normal.     LABORATORY DATA:  I have reviewed the data as listed Lab Results  Component Value Date   WBC 6.7 01/30/2022   HGB 13.0 01/30/2022   HCT 39.0 01/30/2022   MCV 86.3 01/30/2022   PLT 261 01/30/2022   Recent Labs    11/08/21 2230 01/30/22 1722 01/31/22 0936  NA 143 138 138  K 3.9 2.6* 2.9*  CL 106 103 106  CO2 '30 25 23  '$ GLUCOSE 100* 102* 111*  BUN 24* 21 15  CREATININE 1.32* 1.06* 0.79  CALCIUM 9.6 8.8* 8.9  GFRNONAA 38* 50* >60  PROT 7.2 6.3* 5.8*  ALBUMIN 3.7 2.7* 2.4*  AST 14* 102* 102*  ALT 10 106* 100*  ALKPHOS 85 298*  289*  BILITOT 0.3 17.2* 17.0*    RADIOGRAPHIC STUDIES: I have personally reviewed the radiological images as listed and agreed with the findings in the report. CT ABDOMEN PELVIS W CONTRAST  Result Date: 01/30/2022 CLINICAL DATA:  Jaundice EXAM: CT ABDOMEN AND PELVIS WITH CONTRAST TECHNIQUE: Multidetector CT imaging of the abdomen and pelvis was performed using the standard protocol following bolus administration of intravenous contrast. RADIATION DOSE REDUCTION: This exam was performed according to the departmental dose-optimization program which includes automated exposure control, adjustment of the mA and/or kV according to patient size and/or use of iterative reconstruction technique. CONTRAST:  16m OMNIPAQUE IOHEXOL 300 MG/ML  SOLN COMPARISON:  None Available. FINDINGS: Lower chest: No acute abnormality.  Small hiatal hernia Hepatobiliary: A lobulated hypodense enhancing mass is seen centered within the caudate lobe of the liver demonstrating mass effect upon the adjacent portal vein and encasement of the proper hepatic artery as well as the central right and left hepatic arteries. The mass obliterates the biliary confluence, best seen on image # 24-27/2. There is resultant moderate intrahepatic biliary ductal dilation. The mass measures 4.7 x 7.5 x 7.2 cm on axial image # 26/2 and coronal image # 38/5. Though there is abutment and mass effect upon the portal vein, this structure does not appear dated and remains patent. Similarly, the mass demonstrates mass effect upon the pylorus and duodenal bulb without clear invasion though there is focal obliteration of the separating fat plane best seen on coronal image # 31/5. Differential considerations include a primary hepatic mass such as hepatocellular carcinoma versus metastatic disease. No additional intrahepatic mass identified. The gallbladder is contracted and there are numerous intraluminal calculi identified suggesting changes of chronic cholecystitis  in the appropriate clinical setting. No superimposed pericholecystic acute inflammatory change identified. Pancreas: Unremarkable Spleen: Scattered low-attenuation lesions are seen within the spleen measuring up to 18 mm possibly representing small to small cysts or hemangioma. The spleen is not enlarged. No perisplenic fluid collections are seen. Adrenals/Urinary Tract: There is a nodule within the a lateral limb of the left adrenal gland which is indeterminate on this examination measuring 12 mm in thickness, however, this appears stable since prior MRI examination of 12/24/2015 and is likely benign given its stability over time. Right adrenal gland is unremarkable. Moderate bilateral renal cortical atrophy is present, right greater than left. Multiple simple cortical cysts are seen within the kidneys bilaterally. No further follow-up is recommended for these lesions. No enhancing intrarenal mass. No hydronephrosis. No intrarenal or  ureteral calculi. The bladder is unremarkable. Stomach/Bowel: Severe sigmoid diverticulosis without superimposed acute inflammatory change. The stomach, small bowel, and large bowel are otherwise unremarkable. Appendix absent. No free intraperitoneal gas or fluid. Vascular/Lymphatic: Aortic atherosclerosis. No enlarged abdominal or pelvic lymph nodes. Reproductive: Status post hysterectomy. No adnexal masses. Other: No abdominal wall hernia Musculoskeletal: No acute bone abnormality. No lytic or blastic bone lesion. IMPRESSION: 1. 7.5 cm mass centered within the caudate lobe of the liver demonstrating mass effect upon the adjacent portal vein and encasement of the central hepatic arteries and biliary confluence with resultant moderate intrahepatic biliary ductal dilation. Differential considerations include a primary hepatic mass such as hepatocellular carcinoma versus metastatic disease. Correlation with serum AFP and CA 19 9 levels may be helpful for further evaluation. Contrast  enhanced MRI examination is also recommended for further evaluation. 2. Cholelithiasis. 3. Severe sigmoid diverticulosis without superimposed acute inflammatory change. 4. Stable left adrenal nodule, likely benign given its stability over time. This can be definitively assessed simultaneously with MRI examination of the liver. 5. Moderate bilateral renal cortical atrophy, right greater than left. Aortic Atherosclerosis (ICD10-I70.0). Electronically Signed   By: Fidela Salisbury M.D.   On: 01/30/2022 20:25   CT Cervical Spine Wo Contrast  Result Date: 01/30/2022 CLINICAL DATA:  Neck trauma (Age >= 65y) Fall. EXAM: CT CERVICAL SPINE WITHOUT CONTRAST TECHNIQUE: Multidetector CT imaging of the cervical spine was performed without intravenous contrast. Multiplanar CT image reconstructions were also generated. RADIATION DOSE REDUCTION: This exam was performed according to the departmental dose-optimization program which includes automated exposure control, adjustment of the mA and/or kV according to patient size and/or use of iterative reconstruction technique. COMPARISON:  None Available. FINDINGS: Alignment: Broad-based oversew of normal lordosis. No traumatic subluxation. There is degenerative stepwise grade 1 anterolisthesis of C2 on C3, C3 on C4, and C4 on C5. Skull base and vertebrae: No acute fracture. Vertebral body heights are maintained. The dens and skull base are intact. Soft tissues and spinal canal: No prevertebral fluid or swelling. No visible canal hematoma. Disc levels: Diffuse degenerative disc disease most prominently affecting C5-C6 and C6-C7. Moderate multilevel facet hypertrophy. Upper chest: Mild apical emphysema.  No acute findings. Other: Incidental note of medial course of the right common carotid. IMPRESSION: 1. No acute fracture or traumatic subluxation of the cervical spine. 2. Multilevel degenerative disc disease and facet hypertrophy. Electronically Signed   By: Keith Rake M.D.   On:  01/30/2022 19:17   CT Head Wo Contrast  Result Date: 01/30/2022 CLINICAL DATA:  Head trauma, minor (Age >= 65y) Generalized weakness.  Fall yesterday. EXAM: CT HEAD WITHOUT CONTRAST TECHNIQUE: Contiguous axial images were obtained from the base of the skull through the vertex without intravenous contrast. RADIATION DOSE REDUCTION: This exam was performed according to the departmental dose-optimization program which includes automated exposure control, adjustment of the mA and/or kV according to patient size and/or use of iterative reconstruction technique. COMPARISON:  Head CT 10/22/2020 FINDINGS: Brain: No acute intracranial hemorrhage. No evidence of acute ischemia. Stable degree of generalized atrophy and chronic periventricular small vessel ischemic change. Chronic left frontal encephalomalacia with volume loss, unchanged. No subdural or extra-axial collection. No midline shift or mass effect. Vascular: Atherosclerosis of skullbase vasculature without hyperdense vessel or abnormal calcification. Skull: No fracture or focal lesion. Sinuses/Orbits: Chronic opacification of inferior right frontal sinus. No mastoid effusion. Bilateral cataract resection. Other: Small right frontal scalp lipoma, unchanged. IMPRESSION: 1. No acute intracranial abnormality. No skull fracture. 2. Stable atrophy,  chronic small vessel ischemic change, and left frontal encephalomalacia. Electronically Signed   By: Keith Rake M.D.   On: 01/30/2022 19:13     88 y.o. female patient with dementia COPD currently admitted to hospital for obstructive jaundice/CT scan-a large mass in the caudate lobe of the liver involving the biliary system   # obstructive jaundice/CT scan-a large mass in the caudate lobe of the liver involving the biliary system  highly suspicious for malignancy.  Interestingly patient's liver function test was normal 3 months ago.  #Obstructive jaundice-secondary to  #1.    #ECOG performance status: 4  [dementia; falls; advanced COPD]  PLAN/RECOMMENDATIONS:  #I will had a long discussion with the patient's family [son; daughter; granddaughter] regarding the significant findings concerning for malignancy as noted on imaging.   #Discussed the possible options of relief of biliary obstruction-IR guided biliary drainage versus consideration for ERCP. however understands all options are palliative given her advanced age/dementia/COPD etc.  She is not a candidate for any systemic therapies.  #The family is interested in maintaining her quality of life; and not interested in any aggressive procedures.  I would recommend hospice to keep the patient comfortable.  I discussed that the life expectancy is the order of weeks.  Interested in home hospice.  Hospice liaison consulted.  Thank you Dr. Kurtis Bushman for allowing me to participate in the care of your pleasant patient. Please do not hesitate to contact me with questions or concerns in the interim.  Discussed with Dr.Amery.   # I reviewed the blood work- with the patient's family in detail; also reviewed the imaging independently [as summarized above]; and with the patient in detail.

## 2022-01-31 NOTE — Care Management CC44 (Signed)
Condition Code 44 Documentation Completed  Patient Details  Name: Jasmine Hays MRN: [id number] Date of Birth: 1930/06/25   Condition Code 44 given:  Yes Patient signature on Condition Code 44 notice:  Yes Documentation of 2 MD's agreement:  Yes Code 44 added to claim:  Yes    Abhay Godbolt E Yelitza Reach, LCSW 01/31/2022, 11:11 AM

## 2022-01-31 NOTE — Progress Notes (Signed)
Patient ID: Jasmine Hays, female   DOB: 1930/04/17, 88 y.o.   MRN: [id number]    An After Visit Summary was printed and given to the patient.   Patient education given on medication changes and Hospice follow up and the patient expresses understanding and acceptance of instructions.    Son to drive home.  Haydee Salter 01/31/2022 12:47 PM

## 2022-01-31 NOTE — TOC Initial Note (Addendum)
Transition of Care [facility]) - Initial/Assessment Note    Patient Details  Name: Jasmine Hays MRN: [id number] Date of Birth: 1930/03/27  [facility] CM/SW Contact:    Valente David, RN Phone Number: 01/31/2022, 11:15 AM  Clinical Narrative:                 Spoke with patient and family at bedside.  Trisha Mangle, son Ronalee Belts, and granddaughter at bedside, engaged in conversation.  Lives with family, plan to discharge home with hospice.  First choice is [facility], spoke with Barnetta Chapel, awaiting call back regarding acceptance.  Second choice is [facility].  Family denies need for and DME, will provide private transportation home.  Address confirmed and correct in demographics.    Update 11:56 - Spoke with Theadora Rama at [facility], they will accept patient and plan to see in the home tomorrow.  Daughter [name] made aware.  Update 12:23 - Discharge summary faxed to [place], [facility].   Expected Discharge Plan: Home w Hospice Care Barriers to Discharge: Barriers Resolved   Patient Goals and CMS Choice Patient states their goals for this hospitalization and ongoing recovery are:: Home with hospice CMS Medicare.gov Compare Post Acute Care list provided to:: Patient Represenative (must comment) (Daughter, [name]) Choice offered to / list presented to : [facility] POA / Guardian  Expected Discharge Plan and Services Expected Discharge Plan: [place] Acute Care Choice: Hospice Living arrangements for the past 2 months: Single Family Home Expected Discharge Date: 01/31/22                                    Prior Living Arrangements/Services Living arrangements for the past 2 months: Single Family Home Lives with:: Adult Children Patient language and need for interpreter reviewed:: No Do you feel safe going back to the place where you live?: Yes      Need for Family Participation in Patient Care: Yes (Comment) Care giver support system in place?: Yes (comment) Current  home services: Hospice Criminal Activity/Legal Involvement Pertinent to Current Situation/Hospitalization: No - Comment as needed  Activities of Daily Living Home Assistive Devices/Equipment: Environmental consultant (specify type), Wheelchair, Shower chair with back ADL Screening (condition at time of admission) Patient's cognitive ability adequate to safely complete daily activities?: No Is the patient deaf or have difficulty hearing?: Yes Does the patient have difficulty seeing, even when wearing glasses/contacts?: Yes (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) Does the patient have difficulty concentrating, remembering, or making decisions?: Yes Patient able to express need for assistance with ADLs?: Yes Does the patient have difficulty dressing or bathing?: No Independently performs ADLs?: No Dressing (OT): Needs assistance Is this a change from baseline?: Pre-admission baseline Grooming: Needs assistance Is this a change from baseline?: Pre-admission baseline Feeding: Independent Bathing: Needs assistance Is this a change from baseline?: Pre-admission baseline Toileting: Appropriate for developmental age In/Out Bed: Appropriate for developmental age 30 in Home: Appropriate for developmental age Does the patient have difficulty walking or climbing stairs?: Yes Weakness of Legs: Both Weakness of Arms/Hands: Both  Permission Sought/Granted Permission sought to share information with : Case Manager, Other (comment) [facility] and [place]) Permission granted to share information with : Yes, Verbal Permission Granted     Permission granted to share info w AGENCY: [facility] and [facility]        Emotional Assessment  Appearance:: Appears stated age Attitude/Demeanor/Rapport: Engaged, Self-Confident, Gracious   Orientation: : Oriented to Self, Oriented to Place, Oriented to  Time, Oriented to Situation   Psych Involvement: No (comment)  Admission diagnosis:  Generalized  weakness [R53.1] Patient Active Problem List   Diagnosis Date Noted   Generalized weakness 01/30/2022   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 ([place]) 01/30/2022   Hypokalemia 01/30/2022   Inadequate oral intake 01/30/2022   Falls 01/30/2022   Dementia without behavioral disturbance ([place]) 01/30/2022   Venous stasis ulcer ([place]) 01/30/2022   Chronic bronchitis, obstructive ([place]) 01/30/2022   Aggressive behavior 10/20/2020   Restless leg syndrome 06/12/2020   Severe major depression, single episode, without psychotic features ([place]) 05/11/2020   PTSD (post-traumatic stress disorder) 05/11/2020   Mild dementia ([place]) 05/11/2020   Eczema 04/04/2020   Need for influenza vaccination 04/04/2020   Memory loss 01/14/2020   Hearing loss of left ear 01/14/2020   Chronic left-sided low back pain with left-sided sciatica 01/14/2020   Smoker 01/14/2020   Leg pain 10/26/2018   Venous ulcer of right leg ([place]) 10/26/2018   Chronic venous insufficiency 10/26/2018   Lymphedema 10/26/2018   PCP:  [name], MD Pharmacy:   [place], [place] - [place] [address] [place] [address] Phone: [phone] Fax: [phone]     Social Determinants of Health (SDOH) Interventions    Readmission Risk Interventions     No data to display

## 2022-01-31 NOTE — Plan of Care (Signed)
Patient ID: Jasmine Hays, female   DOB: 10-Dec-1929, 88 y.o.   MRN: [id number]  Problem: Education: Goal: Knowledge of General Education information will improve Description: Including pain rating scale, medication(s)/side effects and non-pharmacologic comfort measures Outcome: Adequate for Discharge   Problem: Health Behavior/Discharge Planning: Goal: Ability to manage health-related needs will improve Outcome: Adequate for Discharge   Problem: Clinical Measurements: Goal: Ability to maintain clinical measurements within normal limits will improve Outcome: Adequate for Discharge Goal: Will remain free from infection Outcome: Adequate for Discharge Goal: Diagnostic test results will improve Outcome: Adequate for Discharge Goal: Respiratory complications will improve Outcome: Adequate for Discharge Goal: Cardiovascular complication will be avoided Outcome: Adequate for Discharge   Problem: Activity: Goal: Risk for activity intolerance will decrease Outcome: Adequate for Discharge   Problem: Nutrition: Goal: Adequate nutrition will be maintained Outcome: Adequate for Discharge   Problem: Coping: Goal: Level of anxiety will decrease Outcome: Adequate for Discharge   Problem: Elimination: Goal: Will not experience complications related to bowel motility Outcome: Adequate for Discharge Goal: Will not experience complications related to urinary retention Outcome: Adequate for Discharge   Problem: Pain Managment: Goal: General experience of comfort will improve Outcome: Adequate for Discharge   Problem: Safety: Goal: Ability to remain free from injury will improve Outcome: Adequate for Discharge   Problem: Skin Integrity: Goal: Risk for impaired skin integrity will decrease [name], RN  Adequate for Discharge

## 2022-01-31 NOTE — Discharge Summary (Signed)
Jasmine Hays:[id number] DOB: [birth year] DOA: 01/30/2022  PCP: [name], MD  Admit date: 01/30/2022 Discharge date: 01/31/2022  Admitted From: home Disposition:  home with hospice       Discharge Condition:Stable CODE STATUS:DNR  Diet recommendation: As tolerated.   Brief/Interim Summary: Per XNA:TFTDD C Panella is a 88 y.o. female with medical history significant for COPD, smoker, dementia,  who was brought to the emergency room with a 1 week history of increasing weakness and falls.  She also has decreased oral intake, decreased appetite and has been losing weight.  Daughter noted her to be jaundiced and she was brought to the emergency room for evaluation. ED course and data review: Temperature 97.5 with pulse 86 respirations 18 and BP 148/89 with O2 sat 98% on room air Labs: CBC mostly WNL, CMP significant for potassium of 2.6, creatinine 1.06 which appears to be baseline but more notably elevation in LFTs with AST 102, ALT 106 alk phos 298 and total bilirubin 17.2.  Lipase 68, total CK19 and troponin 12.  Urinalysis unremarkable EKG, personally viewed and interpreted showed NSR at 83 with nonspecific ST-T wave changes Imaging: CT head and C-spine nonacute CT abdomen and pelvis shows the following: IMPRESSION: 1. 7.5 cm mass centered within the caudate lobe of the liver demonstrating mass effect upon the adjacent portal vein and encasement of the central hepatic arteries and biliary confluence with resultant moderate intrahepatic biliary ductal dilation. Differential considerations include a primary hepatic mass such as hepatocellular carcinoma versus metastatic disease. Correlation with serum AFP and CA 19 9 levels may be helpful for further evaluation. Contrast enhanced MRI examination is also recommended for further evaluation. 2. Cholelithiasis. 3. Severe sigmoid diverticulosis without superimposed acute inflammatory change. 4. Stable left adrenal nodule, likely benign  given its stability over time. This can be definitively assessed simultaneously with MRI examination of the liver. 5. Moderate bilateral renal cortical atrophy, right greater than left.    Patient was admitted. Oncology Dr. Burlene Arnt spoke to family and after discussion family agreed to move forward to home with hospice. Daughter changed patient's code status to DNR.  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 [facility] on board. Patient will be discharged with hospice care    Hypokalemia repleted   Generalized weakness Inadequate oral intake Frequent falls Secondary to suspected malignancy, physical deconditioning, electrolyte abnormalities    Chronic bronchitis, obstructive (HCC) No acute exacerbation    Dementia without behavioral disturbance ([place])  Continue home meds     Discharge Diagnoses:  Principal Problem:   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 [facility]) Active Problems:   Hypokalemia   Generalized weakness   Inadequate oral intake   Falls   Smoker   Dementia without behavioral disturbance ([place])   Chronic bronchitis, obstructive (HCC)    Discharge Instructions  Discharge Instructions     Diet - low sodium heart healthy   Complete by: As directed    Increase activity slowly   Complete by: As directed       Allergies as of 01/31/2022   No Known Allergies      Medication List     STOP taking these medications    cephALEXin 500 MG capsule Commonly known as: KEFLEX   clonazePAM 0.5 MG tablet Commonly known as: KLONOPIN        No Known Allergies  Consultations: oncology   Procedures/Studies: CT ABDOMEN PELVIS W CONTRAST  Result Date: 01/30/2022 CLINICAL DATA:  Jaundice EXAM: CT ABDOMEN AND PELVIS WITH CONTRAST TECHNIQUE:  Multidetector CT imaging of the abdomen and pelvis was performed using the standard protocol following bolus administration of intravenous contrast. RADIATION DOSE REDUCTION: This exam was performed  according to the departmental dose-optimization program which includes automated exposure control, adjustment of the mA and/or kV according to patient size and/or use of iterative reconstruction technique. CONTRAST:  27mL OMNIPAQUE IOHEXOL 300 MG/ML  SOLN COMPARISON:  None Available. FINDINGS: Lower chest: No acute abnormality.  Small hiatal hernia Hepatobiliary: A lobulated hypodense enhancing mass is seen centered within the caudate lobe of the liver demonstrating mass effect upon the adjacent portal vein and encasement of the proper hepatic artery as well as the central right and left hepatic arteries. The mass obliterates the biliary confluence, best seen on image # 24-27/2. There is resultant moderate intrahepatic biliary ductal dilation. The mass measures 4.7 x 7.5 x 7.2 cm on axial image # 26/2 and coronal image # 38/5. Though there is abutment and mass effect upon the portal vein, this structure does not appear dated and remains patent. Similarly, the mass demonstrates mass effect upon the pylorus and duodenal bulb without clear invasion though there is focal obliteration of the separating fat plane best seen on coronal image # 31/5. Differential considerations include a primary hepatic mass such as hepatocellular carcinoma versus metastatic disease. No additional intrahepatic mass identified. The gallbladder is contracted and there are numerous intraluminal calculi identified suggesting changes of chronic cholecystitis in the appropriate clinical setting. No superimposed pericholecystic acute inflammatory change identified. Pancreas: Unremarkable Spleen: Scattered low-attenuation lesions are seen within the spleen measuring up to 18 mm possibly representing small to small cysts or hemangioma. The spleen is not enlarged. No perisplenic fluid collections are seen. Adrenals/Urinary Tract: There is a nodule within the a lateral limb of the left adrenal gland which is indeterminate on this examination measuring  12 mm in thickness, however, this appears stable since prior MRI examination of 12/24/2015 and is likely benign given its stability over time. Right adrenal gland is unremarkable. Moderate bilateral renal cortical atrophy is present, right greater than left. Multiple simple cortical cysts are seen within the kidneys bilaterally. No further follow-up is recommended for these lesions. No enhancing intrarenal mass. No hydronephrosis. No intrarenal or ureteral calculi. The bladder is unremarkable. Stomach/Bowel: Severe sigmoid diverticulosis without superimposed acute inflammatory change. The stomach, small bowel, and large bowel are otherwise unremarkable. Appendix absent. No free intraperitoneal gas or fluid. Vascular/Lymphatic: Aortic atherosclerosis. No enlarged abdominal or pelvic lymph nodes. Reproductive: Status post hysterectomy. No adnexal masses. Other: No abdominal wall hernia Musculoskeletal: No acute bone abnormality. No lytic or blastic bone lesion. IMPRESSION: 1. 7.5 cm mass centered within the caudate lobe of the liver demonstrating mass effect upon the adjacent portal vein and encasement of the central hepatic arteries and biliary confluence with resultant moderate intrahepatic biliary ductal dilation. Differential considerations include a primary hepatic mass such as hepatocellular carcinoma versus metastatic disease. Correlation with serum AFP and CA 19 9 levels may be helpful for further evaluation. Contrast enhanced MRI examination is also recommended for further evaluation. 2. Cholelithiasis. 3. Severe sigmoid diverticulosis without superimposed acute inflammatory change. 4. Stable left adrenal nodule, likely benign given its stability over time. This can be definitively assessed simultaneously with MRI examination of the liver. 5. Moderate bilateral renal cortical atrophy, right greater than left. Aortic Atherosclerosis (ICD10-I70.0). Electronically Signed   By: Fidela Salisbury M.D.   On:  01/30/2022 20:25   CT Cervical Spine Wo Contrast  Result Date: 01/30/2022 CLINICAL  DATA:  Neck trauma (Age >= 65y) Fall. EXAM: CT CERVICAL SPINE WITHOUT CONTRAST TECHNIQUE: Multidetector CT imaging of the cervical spine was performed without intravenous contrast. Multiplanar CT image reconstructions were also generated. RADIATION DOSE REDUCTION: This exam was performed according to the departmental dose-optimization program which includes automated exposure control, adjustment of the mA and/or kV according to patient size and/or use of iterative reconstruction technique. COMPARISON:  None Available. FINDINGS: Alignment: Broad-based oversew of normal lordosis. No traumatic subluxation. There is degenerative stepwise grade 1 anterolisthesis of C2 on C3, C3 on C4, and C4 on C5. Skull base and vertebrae: No acute fracture. Vertebral body heights are maintained. The dens and skull base are intact. Soft tissues and spinal canal: No prevertebral fluid or swelling. No visible canal hematoma. Disc levels: Diffuse degenerative disc disease most prominently affecting C5-C6 and C6-C7. Moderate multilevel facet hypertrophy. Upper chest: Mild apical emphysema.  No acute findings. Other: Incidental note of medial course of the right common carotid. IMPRESSION: 1. No acute fracture or traumatic subluxation of the cervical spine. 2. Multilevel degenerative disc disease and facet hypertrophy. Electronically Signed   By: Keith Rake M.D.   On: 01/30/2022 19:17   CT Head Wo Contrast  Result Date: 01/30/2022 CLINICAL DATA:  Head trauma, minor (Age >= 65y) Generalized weakness.  Fall yesterday. EXAM: CT HEAD WITHOUT CONTRAST TECHNIQUE: Contiguous axial images were obtained from the base of the skull through the vertex without intravenous contrast. RADIATION DOSE REDUCTION: This exam was performed according to the departmental dose-optimization program which includes automated exposure control, adjustment of the mA and/or kV  according to patient size and/or use of iterative reconstruction technique. COMPARISON:  Head CT 10/22/2020 FINDINGS: Brain: No acute intracranial hemorrhage. No evidence of acute ischemia. Stable degree of generalized atrophy and chronic periventricular small vessel ischemic change. Chronic left frontal encephalomalacia with volume loss, unchanged. No subdural or extra-axial collection. No midline shift or mass effect. Vascular: Atherosclerosis of skullbase vasculature without hyperdense vessel or abnormal calcification. Skull: No fracture or focal lesion. Sinuses/Orbits: Chronic opacification of inferior right frontal sinus. No mastoid effusion. Bilateral cataract resection. Other: Small right frontal scalp lipoma, unchanged. IMPRESSION: 1. No acute intracranial abnormality. No skull fracture. 2. Stable atrophy, chronic small vessel ischemic change, and left frontal encephalomalacia. Electronically Signed   By: Keith Rake M.D.   On: 01/30/2022 19:13      Subjective: Has no complaints  Discharge Exam: Vitals:   01/31/22 0540 01/31/22 0826  BP: (!) 146/68 (!) 143/68  Pulse: 79 (!) 58  Resp: 16 16  Temp: 98.7 F (37.1 C) 97.8 F (36.6 C)  SpO2: 97% 98%   Vitals:   01/30/22 2215 01/30/22 2317 01/31/22 0540 01/31/22 0826  BP:  (!) 155/83 (!) 146/68 (!) 143/68  Pulse: 82 78 79 (!) 58  Resp: (!) $RemoveB'23 20 16 16  'pYHuJaNt$ Temp:  98.1 F (36.7 C) 98.7 F (37.1 C) 97.8 F (36.6 C)  TempSrc:   Oral Oral  SpO2: 98% 99% 97% 98%  Weight:  69.2 kg    Height:  $Remove'5\' 6"'nnyarwd$  (1.676 m)      General: Pt is alert, awake, not in acute distress Cardiovascular: RRR, S1/S2 +, no rubs, no gallops Respiratory: CTA bilaterally, no wheezing, no rhonchi Abdominal: Soft, NT, ND, bowel sounds + Extremities: no edema, no cyanosis    The results of significant diagnostics from this hospitalization (including imaging, microbiology, ancillary and laboratory) are listed below for reference.     Microbiology: No results  found for this or any previous visit (from the past 240 hour(s)).   Labs: BNP (last 3 results) No results for input(s): "BNP" in the last 8760 hours. Basic Metabolic Panel: Recent Labs  Lab 01/30/22 1722 01/31/22 0936  NA 138 138  K 2.6* 2.9*  CL 103 106  CO2 25 23  GLUCOSE 102* 111*  BUN 21 15  CREATININE 1.06* 0.79  CALCIUM 8.8* 8.9   Liver Function Tests: Recent Labs  Lab 01/30/22 1722 01/31/22 0936  AST 102* 102*  ALT 106* 100*  ALKPHOS 298* 289*  BILITOT 17.2* 17.0*  PROT 6.3* 5.8*  ALBUMIN 2.7* 2.4*   Recent Labs  Lab 01/30/22 1722  LIPASE 68*   No results for input(s): "AMMONIA" in the last 168 hours. CBC: Recent Labs  Lab 01/30/22 1722  WBC 6.7  NEUTROABS 5.1  HGB 13.0  HCT 39.0  MCV 86.3  PLT 261   Cardiac Enzymes: Recent Labs  Lab 01/30/22 1722  CKTOTAL 19*   BNP: Invalid input(s): "POCBNP" CBG: No results for input(s): "GLUCAP" in the last 168 hours. D-Dimer No results for input(s): "DDIMER" in the last 72 hours. Hgb A1c No results for input(s): "HGBA1C" in the last 72 hours. Lipid Profile No results for input(s): "CHOL", "HDL", "LDLCALC", "TRIG", "CHOLHDL", "LDLDIRECT" in the last 72 hours. Thyroid function studies No results for input(s): "TSH", "T4TOTAL", "T3FREE", "THYROIDAB" in the last 72 hours.  Invalid input(s): "FREET3" Anemia work up No results for input(s): "VITAMINB12", "FOLATE", "FERRITIN", "TIBC", "IRON", "RETICCTPCT" in the last 72 hours. Urinalysis    Component Value Date/Time   COLORURINE AMBER (A) 01/30/2022 1921   APPEARANCEUR CLEAR (A) 01/30/2022 1921   APPEARANCEUR Clear 02/12/2014 0040   LABSPEC 1.016 01/30/2022 1921   LABSPEC 1.018 02/12/2014 0040   PHURINE 5.0 01/30/2022 1921   GLUCOSEU NEGATIVE 01/30/2022 1921   GLUCOSEU Negative 02/12/2014 0040   HGBUR NEGATIVE 01/30/2022 1921   BILIRUBINUR MODERATE (A) 01/30/2022 1921   BILIRUBINUR Negative 02/12/2014 0040   KETONESUR NEGATIVE 01/30/2022 1921    PROTEINUR NEGATIVE 01/30/2022 1921   NITRITE NEGATIVE 01/30/2022 1921   LEUKOCYTESUR NEGATIVE 01/30/2022 1921   LEUKOCYTESUR Negative 02/12/2014 0040   Sepsis Labs Recent Labs  Lab 01/30/22 1722  WBC 6.7   Microbiology No results found for this or any previous visit (from the past 240 hour(s)).   Time coordinating discharge: Over 30 minutes  SIGNED:   Nolberto Hanlon, MD  [facility] 01/31/2022, 12:09 PM Pager   If 7PM-7AM, please contact night-coverage [email or web address] Password [email or web address]

## 2022-02-01 ENCOUNTER — Telehealth: Payer: Self-pay | Admitting: *Deleted

## 2022-02-01 DIAGNOSIS — R160 Hepatomegaly, not elsewhere classified: Secondary | ICD-10-CM

## 2022-02-01 DIAGNOSIS — R627 Adult failure to thrive: Secondary | ICD-10-CM

## 2022-02-01 NOTE — Telephone Encounter (Signed)
Spoke with Daughter, [name]. Daughter called to request a change in hospice services. Pt was d/c with [facility] hospice but family now wants to go with [facility] in case pt needs local Hospice Home services. Requesting referral to [facility]. Order entered on behalf of Dr. Rogue Bussing.

## 2022-02-08 ENCOUNTER — Telehealth: Payer: Self-pay | Admitting: *Deleted

## 2022-02-08 NOTE — Telephone Encounter (Signed)
[name], daughter called asking to speak with Dr B regarding a stint placement [phone]

## 2022-02-09 NOTE — Progress Notes (Signed)
I returned Jasmine Hays/daughter's phone call.  Patient's granddaughter states that they do not have any questions at this time.  And her mother Jasmine Hays is resting.  They will call us for any further information/questions.

## 2022-02-26 DEATH — deceased
